# Patient Record
Sex: Female | Born: 1955 | Race: Black or African American | Hispanic: No | Marital: Married | Smoking: Never smoker
Health system: Southern US, Community
[De-identification: ages and names within clinical notes are randomized; demographics above are authoritative.]

## PROBLEM LIST (undated history)

## (undated) DIAGNOSIS — I639 Cerebral infarction, unspecified: Secondary | ICD-10-CM

## (undated) DIAGNOSIS — I10 Essential (primary) hypertension: Secondary | ICD-10-CM

## (undated) DIAGNOSIS — J45909 Unspecified asthma, uncomplicated: Secondary | ICD-10-CM

## (undated) DIAGNOSIS — E785 Hyperlipidemia, unspecified: Secondary | ICD-10-CM

## (undated) HISTORY — DX: Cerebral infarction, unspecified: I63.9

## (undated) HISTORY — PX: COLONOSCOPY: SHX174

## (undated) HISTORY — DX: Unspecified asthma, uncomplicated: J45.909

## (undated) HISTORY — DX: Hyperlipidemia, unspecified: E78.5

## (undated) HISTORY — DX: Essential (primary) hypertension: I10

---

## 2001-05-09 ENCOUNTER — Other Ambulatory Visit: Admission: RE | Admit: 2001-05-09 | Discharge: 2001-05-09 | Payer: Self-pay | Admitting: Obstetrics and Gynecology

## 2003-05-16 ENCOUNTER — Encounter: Payer: Self-pay | Admitting: Family Medicine

## 2003-05-16 ENCOUNTER — Ambulatory Visit (HOSPITAL_COMMUNITY): Admission: RE | Admit: 2003-05-16 | Discharge: 2003-05-16 | Payer: Self-pay | Admitting: Family Medicine

## 2005-05-20 ENCOUNTER — Ambulatory Visit (HOSPITAL_COMMUNITY): Admission: RE | Admit: 2005-05-20 | Discharge: 2005-05-20 | Payer: Self-pay | Admitting: Family Medicine

## 2006-08-17 ENCOUNTER — Ambulatory Visit (HOSPITAL_COMMUNITY): Admission: RE | Admit: 2006-08-17 | Discharge: 2006-08-17 | Payer: Self-pay | Admitting: Family Medicine

## 2006-09-29 ENCOUNTER — Emergency Department (HOSPITAL_COMMUNITY): Admission: EM | Admit: 2006-09-29 | Discharge: 2006-09-29 | Payer: Self-pay | Admitting: Emergency Medicine

## 2007-08-21 ENCOUNTER — Ambulatory Visit (HOSPITAL_COMMUNITY): Admission: RE | Admit: 2007-08-21 | Discharge: 2007-08-21 | Payer: Self-pay | Admitting: Family Medicine

## 2007-10-17 ENCOUNTER — Ambulatory Visit (HOSPITAL_COMMUNITY): Admission: RE | Admit: 2007-10-17 | Discharge: 2007-10-17 | Payer: Self-pay | Admitting: Gastroenterology

## 2007-10-17 ENCOUNTER — Ambulatory Visit: Payer: Self-pay | Admitting: Gastroenterology

## 2008-08-26 ENCOUNTER — Ambulatory Visit (HOSPITAL_COMMUNITY): Admission: RE | Admit: 2008-08-26 | Discharge: 2008-08-26 | Payer: Self-pay | Admitting: Family Medicine

## 2008-10-12 ENCOUNTER — Emergency Department (HOSPITAL_COMMUNITY): Admission: EM | Admit: 2008-10-12 | Discharge: 2008-10-12 | Payer: Self-pay | Admitting: Emergency Medicine

## 2008-11-01 ENCOUNTER — Emergency Department (HOSPITAL_COMMUNITY): Admission: EM | Admit: 2008-11-01 | Discharge: 2008-11-01 | Payer: Self-pay | Admitting: Emergency Medicine

## 2008-12-31 ENCOUNTER — Ambulatory Visit (HOSPITAL_COMMUNITY): Admission: RE | Admit: 2008-12-31 | Discharge: 2008-12-31 | Payer: Self-pay | Admitting: Family Medicine

## 2009-02-05 ENCOUNTER — Ambulatory Visit (HOSPITAL_COMMUNITY): Admission: RE | Admit: 2009-02-05 | Discharge: 2009-02-05 | Payer: Self-pay | Admitting: Neurology

## 2009-03-24 ENCOUNTER — Ambulatory Visit (HOSPITAL_COMMUNITY): Admission: RE | Admit: 2009-03-24 | Discharge: 2009-03-24 | Payer: Self-pay | Admitting: Neurology

## 2009-03-24 ENCOUNTER — Ambulatory Visit: Payer: Self-pay | Admitting: Cardiology

## 2009-03-24 ENCOUNTER — Encounter: Payer: Self-pay | Admitting: Neurology

## 2009-09-03 ENCOUNTER — Ambulatory Visit (HOSPITAL_COMMUNITY): Admission: RE | Admit: 2009-09-03 | Discharge: 2009-09-03 | Payer: Self-pay | Admitting: Family Medicine

## 2010-09-07 ENCOUNTER — Ambulatory Visit (HOSPITAL_COMMUNITY): Admission: RE | Admit: 2010-09-07 | Discharge: 2010-09-07 | Payer: Self-pay | Admitting: Family Medicine

## 2011-04-13 NOTE — Op Note (Signed)
NAME:  Erin Foster, Erin Foster              ACCOUNT NO.:  [id number]   MEDICAL RECORD NO.:  [id number]          PATIENT TYPE:  AMB   LOCATION:  DAY                           FACILITY:  [facility]   PHYSICIAN:  Kassie Mends, M.D.      DATE OF BIRTH:  March 08, 1956   DATE OF PROCEDURE:  10/17/2007  DATE OF DISCHARGE:                               OPERATIVE REPORT   REFERRING PHYSICIAN:  Donna Bernard, M.D.   INDICATION FOR EXAM:  Ms. Erin Foster is a 55 year old female who presents  for average risk colon cancer screening.   FINDINGS:  1. Normal colon without evidence of polyps, masses, inflammatory      changes, diverticula or AVMs.  2. Normal retroflexed view of the rectum.   RECOMMENDATIONS:  1. She should follow a high fiber diet.  She is given a handout on      high-fiber diet.  2. Screening colonoscopy in 10 years.   MEDICATIONS:  1. Demerol 50 mg IV.  2. Versed 4 mg IV.   PROCEDURE TECHNIQUE:  Physical exam was performed.  Informed consent was  obtained from the patient after explaining the benefits, risks and  alternatives to the procedure. The patient was connected to the monitor  and placed in the left lateral position.  Continuous oxygen was provided  by nasal cannula, IV medicine administered through an indwelling  cannula.  After administration of sedation and rectal exam, the  patient's rectum was intubated and the scope was advanced under direct  visualization to the cecum.  The scope was removed slowly by carefully  examining the color, texture, anatomy and integrity of the mucosa on the  way out.  The patient was recovered in endoscopy and discharged home in  satisfactory condition.      Kassie Mends, M.D.  Electronically Signed     SM/MEDQ  D:  10/17/2007  T:  10/17/2007  Job:  [id number]   cc:   Donna Bernard, M.D.  Fax: [phone]

## 2011-08-31 LAB — DIFFERENTIAL
Eosinophils Absolute: 0.1
Eosinophils Relative: 1
Lymphs Abs: 1.3
Monocytes Absolute: 0.4
Monocytes Relative: 6

## 2011-08-31 LAB — BASIC METABOLIC PANEL
CO2: 28
Chloride: 101
GFR calc Af Amer: >60
Potassium: 3.3 — ABNORMAL LOW

## 2011-08-31 LAB — URINALYSIS, ROUTINE W REFLEX MICROSCOPIC
Glucose, UA: NEGATIVE
Hgb urine dipstick: NEGATIVE
pH: 6

## 2011-08-31 LAB — LIPASE, BLOOD: Lipase: 12

## 2011-08-31 LAB — HEPATIC FUNCTION PANEL
Albumin: 3.9
Alkaline Phosphatase: 50
Bilirubin, Direct: 0.1
Indirect Bilirubin: 1.1 — ABNORMAL HIGH
Total Bilirubin: 1.2

## 2011-08-31 LAB — CBC
HCT: 46.1 — ABNORMAL HIGH
Hemoglobin: 15.6 — ABNORMAL HIGH
MCV: 83
RBC: 5.56 — ABNORMAL HIGH
WBC: 6.5

## 2011-09-01 ENCOUNTER — Other Ambulatory Visit: Payer: Self-pay | Admitting: Family Medicine

## 2011-09-01 DIAGNOSIS — Z139 Encounter for screening, unspecified: Secondary | ICD-10-CM

## 2011-09-03 LAB — POCT I-STAT, CHEM 8
BUN: 12 mg/dL (ref 6–23)
Calcium, Ion: 1.08 mmol/L — ABNORMAL LOW (ref 1.12–1.32)
TCO2: 29 mmol/L (ref 0–100)

## 2011-09-10 ENCOUNTER — Ambulatory Visit (HOSPITAL_COMMUNITY)
Admission: RE | Admit: 2011-09-10 | Discharge: 2011-09-10 | Disposition: A | Discharge status: Home / Self Care | Payer: BC Managed Care – PPO | Source: Ambulatory Visit | Attending: Family Medicine | Admitting: Family Medicine

## 2011-09-10 DIAGNOSIS — Z139 Encounter for screening, unspecified: Secondary | ICD-10-CM

## 2011-09-10 DIAGNOSIS — Z1231 Encounter for screening mammogram for malignant neoplasm of breast: Secondary | ICD-10-CM | POA: Insufficient documentation

## 2012-09-07 ENCOUNTER — Other Ambulatory Visit: Payer: Self-pay | Admitting: Family Medicine

## 2012-09-07 DIAGNOSIS — Z139 Encounter for screening, unspecified: Secondary | ICD-10-CM

## 2012-09-12 ENCOUNTER — Ambulatory Visit (HOSPITAL_COMMUNITY)
Admission: RE | Admit: 2012-09-12 | Discharge: 2012-09-12 | Disposition: A | Discharge status: Home / Self Care | Payer: BC Managed Care – PPO | Source: Ambulatory Visit | Attending: Family Medicine | Admitting: Family Medicine

## 2012-09-12 DIAGNOSIS — Z1231 Encounter for screening mammogram for malignant neoplasm of breast: Secondary | ICD-10-CM | POA: Insufficient documentation

## 2012-09-12 DIAGNOSIS — Z139 Encounter for screening, unspecified: Secondary | ICD-10-CM

## 2013-02-28 ENCOUNTER — Other Ambulatory Visit (HOSPITAL_COMMUNITY): Payer: Self-pay | Admitting: Family Medicine

## 2013-02-28 ENCOUNTER — Other Ambulatory Visit: Payer: Self-pay | Admitting: *Deleted

## 2013-03-02 ENCOUNTER — Other Ambulatory Visit: Payer: Self-pay | Admitting: *Deleted

## 2013-03-02 ENCOUNTER — Other Ambulatory Visit (HOSPITAL_COMMUNITY): Payer: Self-pay | Admitting: Family Medicine

## 2013-03-02 MED ORDER — TRIAMTERENE-HCTZ 37.5-25 MG PO CAPS: 1.0000 | ORAL_CAPSULE | ORAL | Status: DC

## 2013-04-12 ENCOUNTER — Other Ambulatory Visit (HOSPITAL_COMMUNITY): Payer: Self-pay | Admitting: Family Medicine

## 2013-04-16 ENCOUNTER — Other Ambulatory Visit (HOSPITAL_COMMUNITY): Payer: Self-pay | Admitting: Family Medicine

## 2013-06-03 ENCOUNTER — Other Ambulatory Visit (HOSPITAL_COMMUNITY): Payer: Self-pay | Admitting: Family Medicine

## 2013-06-05 ENCOUNTER — Encounter: Payer: Self-pay | Admitting: *Deleted

## 2013-07-24 ENCOUNTER — Encounter: Payer: Self-pay | Admitting: Family Medicine

## 2013-07-24 ENCOUNTER — Ambulatory Visit (INDEPENDENT_AMBULATORY_CARE_PROVIDER_SITE_OTHER): Payer: BC Managed Care – PPO | Admitting: Family Medicine

## 2013-07-24 VITALS — BP 132/84 | Ht 61.0 in | Wt 154.0 lb

## 2013-07-24 DIAGNOSIS — E782 Mixed hyperlipidemia: Secondary | ICD-10-CM

## 2013-07-24 DIAGNOSIS — Z8673 Personal history of transient ischemic attack (TIA), and cerebral infarction without residual deficits: Secondary | ICD-10-CM

## 2013-07-24 DIAGNOSIS — E785 Hyperlipidemia, unspecified: Secondary | ICD-10-CM

## 2013-07-24 DIAGNOSIS — Z79899 Other long term (current) drug therapy: Secondary | ICD-10-CM

## 2013-07-24 DIAGNOSIS — I10 Essential (primary) hypertension: Secondary | ICD-10-CM

## 2013-07-24 MED ORDER — TRIAMTERENE-HCTZ 37.5-25 MG PO CAPS: 1.0000 | ORAL_CAPSULE | ORAL | Status: DC

## 2013-07-24 MED ORDER — LISINOPRIL 5 MG PO TABS: ORAL_TABLET | ORAL | Status: DC

## 2013-07-24 MED ORDER — AMLODIPINE BESYLATE 10 MG PO TABS: ORAL_TABLET | ORAL | Status: DC

## 2013-07-24 MED ORDER — SIMVASTATIN 20 MG PO TABS: 20.0000 mg | ORAL_TABLET | Freq: Every evening | ORAL | Status: DC

## 2013-07-24 MED ORDER — DICLOFENAC SODIUM 75 MG PO TBEC: 75.0000 mg | DELAYED_RELEASE_TABLET | Freq: Two times a day (BID) | ORAL | Status: DC

## 2013-07-24 NOTE — Patient Instructions (Signed)
Exercise shoulder as recommended

## 2013-07-24 NOTE — Progress Notes (Signed)
  Subjective:    Patient ID: Erin Foster, female    DOB: [birth year], 57 y.o.   MRN: [id number]  Hypertension This is a chronic problem. The current episode started more than 1 year ago. The problem is unchanged. The problem is controlled. Pertinent negatives include no anxiety, blurred vision or chest pain. Risk factors for coronary artery disease include dyslipidemia. Past treatments include ACE inhibitors and calcium channel blockers. The current treatment provides moderate improvement. Compliance problems: so so on diet anf exercise.  Hypertensive end-organ damage includes CVA.  Hyperlipidemia This is a chronic problem. The problem is controlled. Recent lipid tests were reviewed and are variable. She has no history of hypothyroidism or liver disease. Factors aggravating her hyperlipidemia include fatty foods. Pertinent negatives include no chest pain. Current antihyperlipidemic treatment includes statins and exercise. The current treatment provides moderate improvement of lipids. There are no compliance problems.     Patient arrives for a follow up on her blood pressure.    Patient also having problems with right shoulder pain.right shoulder, grinding sens intermittently, no injury, right handed, No meds, hurts any time  Review of Systems  Eyes: Negative for blurred vision.  Cardiovascular: Negative for chest pain.       Objective:   Physical Exam  Alert no acute distress. Positive impingement sign in the right shoulder. Lungs clear. Heart regular in rhythm. HEENT normal. Ankles without edema. No focal neurological deficits. Significant pain with rotation of shoulder.      Asses sment & Plan:  Impression #1 shoulder strain. #2 hypertension good control. #3 hyperlipidemia status uncertain discuss. Plan Codman's exercises recommended. Voltaren twice a day with food when necessary for pain. Refill medications. Appropriate blood work. Diet exercise discussed. WSL

## 2013-07-28 LAB — HEPATIC FUNCTION PANEL
ALT: 19 U/L (ref 0–35)
Bilirubin, Direct: 0.1 mg/dL (ref 0.0–0.3)

## 2013-07-28 LAB — LIPID PANEL
Cholesterol: 205 mg/dL — ABNORMAL HIGH (ref 0–200)
Total CHOL/HDL Ratio: 2.1 Ratio
VLDL: 8 mg/dL (ref 0–40)

## 2013-08-01 ENCOUNTER — Encounter: Payer: Self-pay | Admitting: Family Medicine

## 2013-08-15 ENCOUNTER — Other Ambulatory Visit: Payer: Self-pay | Admitting: Family Medicine

## 2013-09-17 ENCOUNTER — Other Ambulatory Visit: Payer: Self-pay | Admitting: Family Medicine

## 2013-09-17 DIAGNOSIS — Z139 Encounter for screening, unspecified: Secondary | ICD-10-CM

## 2013-09-18 ENCOUNTER — Ambulatory Visit (HOSPITAL_COMMUNITY)
Admission: RE | Admit: 2013-09-18 | Discharge: 2013-09-18 | Disposition: A | Discharge status: Home / Self Care | Payer: BC Managed Care – PPO | Source: Ambulatory Visit | Attending: Family Medicine | Admitting: Family Medicine

## 2013-09-18 DIAGNOSIS — Z1231 Encounter for screening mammogram for malignant neoplasm of breast: Secondary | ICD-10-CM | POA: Insufficient documentation

## 2013-09-18 DIAGNOSIS — Z139 Encounter for screening, unspecified: Secondary | ICD-10-CM

## 2013-12-25 ENCOUNTER — Ambulatory Visit (INDEPENDENT_AMBULATORY_CARE_PROVIDER_SITE_OTHER): Payer: BC Managed Care – PPO | Admitting: Nurse Practitioner

## 2013-12-25 ENCOUNTER — Encounter: Payer: Self-pay | Admitting: Nurse Practitioner

## 2013-12-25 VITALS — BP 110/80 | Ht 60.5 in | Wt 156.1 lb

## 2013-12-25 DIAGNOSIS — N390 Urinary tract infection, site not specified: Secondary | ICD-10-CM

## 2013-12-25 DIAGNOSIS — N950 Postmenopausal bleeding: Secondary | ICD-10-CM

## 2013-12-25 DIAGNOSIS — Z01419 Encounter for gynecological examination (general) (routine) without abnormal findings: Secondary | ICD-10-CM

## 2013-12-25 DIAGNOSIS — E785 Hyperlipidemia, unspecified: Secondary | ICD-10-CM

## 2013-12-25 DIAGNOSIS — Z0000 Encounter for general adult medical examination without abnormal findings: Secondary | ICD-10-CM

## 2013-12-25 DIAGNOSIS — E559 Vitamin D deficiency, unspecified: Secondary | ICD-10-CM

## 2013-12-25 DIAGNOSIS — I10 Essential (primary) hypertension: Secondary | ICD-10-CM

## 2013-12-25 DIAGNOSIS — Z23 Encounter for immunization: Secondary | ICD-10-CM

## 2013-12-25 LAB — POCT URINALYSIS DIPSTICK
PH UA: 6
RBC UA: 250
SPEC GRAV UA: 1.01

## 2013-12-25 LAB — POCT UA - MICROSCOPIC ONLY: BACTERIA, U MICROSCOPIC: POSITIVE

## 2013-12-25 MED ORDER — CIPROFLOXACIN HCL 250 MG PO TABS: 250.0000 mg | ORAL_TABLET | Freq: Two times a day (BID) | ORAL | Status: DC

## 2013-12-28 ENCOUNTER — Ambulatory Visit (HOSPITAL_COMMUNITY): Admission: RE | Admit: 2013-12-28 | Payer: BC Managed Care – PPO | Source: Ambulatory Visit

## 2013-12-28 ENCOUNTER — Ambulatory Visit (HOSPITAL_COMMUNITY)
Admission: RE | Admit: 2013-12-28 | Discharge: 2013-12-28 | Disposition: A | Discharge status: Home / Self Care | Payer: BC Managed Care – PPO | Source: Ambulatory Visit | Attending: Nurse Practitioner | Admitting: Nurse Practitioner

## 2013-12-28 DIAGNOSIS — N950 Postmenopausal bleeding: Secondary | ICD-10-CM | POA: Insufficient documentation

## 2013-12-30 DIAGNOSIS — N950 Postmenopausal bleeding: Secondary | ICD-10-CM | POA: Insufficient documentation

## 2013-12-30 DIAGNOSIS — E559 Vitamin D deficiency, unspecified: Secondary | ICD-10-CM | POA: Insufficient documentation

## 2013-12-30 NOTE — Assessment & Plan Note (Signed)
Stable, labs pending.

## 2013-12-30 NOTE — Assessment & Plan Note (Signed)
Labs pending.  

## 2013-12-30 NOTE — Progress Notes (Signed)
Subjective:    Patient ID: Erin Foster, female    DOB: [birth year], 57 y.o.   MRN: [id number]  HPI presents for wellness exam. Did not have a menstrual cycle for over a year. Had some spotting in August, September, October and again this month. Very light spotting, had to wear a light pad for one day. Same sexual partner. No pelvic pain. No vaginal discharge. Regular vision and eye exams. Yesterday began having urinary urgency and frequency. Some dysuria. No fever. No back or flank pain. Mild mid pelvic area pressure. No history of recent UTI.    Review of Systems  Constitutional: Negative for activity change, appetite change and fatigue.  HENT: Negative for dental problem, ear pain, sinus pressure and sore throat.   Respiratory: Negative for cough, chest tightness, shortness of breath and wheezing.   Cardiovascular: Negative for chest pain and leg swelling.  Gastrointestinal: Negative for nausea, vomiting, abdominal pain, diarrhea, constipation and blood in stool.  Genitourinary: Positive for dysuria, urgency, frequency, vaginal bleeding, difficulty urinating and menstrual problem. Negative for flank pain, vaginal discharge and pelvic pain.       Objective:   Physical Exam  Vitals reviewed. Constitutional: She is oriented to person, place, and time. She appears well-developed. No distress.  HENT:  Right Ear: External ear normal.  Left Ear: External ear normal.  Mouth/Throat: Oropharynx is clear and moist.  Neck: Normal range of motion. Neck supple. No tracheal deviation present. No thyromegaly present.  Cardiovascular: Normal rate, regular rhythm and normal heart sounds.  Exam reveals no gallop.   No murmur heard. Pulmonary/Chest: Effort normal and breath sounds normal.  Abdominal: Soft. She exhibits no distension. There is no tenderness.  Genitourinary: Vagina normal and uterus normal. No vaginal discharge found.  Musculoskeletal: She exhibits no edema.  Lymphadenopathy:   She has no cervical adenopathy.  Neurological: She is alert and oriented to person, place, and time.  Skin: Skin is warm and dry. No rash noted.  Psychiatric: She has a normal mood and affect. Her behavior is normal.   Breast exam: No masses noted, axilla no adenopathy. External GU normal. Vagina slightly pink with clear discharge. Cervix normal limit in appearance, no CMT. Bimanual exam uterus and adnexa normal and nontender. Mild suprapubic area tenderness on exam. No CVA or flank tenderness on exam. Urine microscopic WBC TNTC, RBCs TNTC and bacteria noted. Rectal exam normal, no stool for Hemoccult.       Assessment & Plan:  Well woman exam  UTI (urinary tract infection) - Plan: POCT urinalysis dipstick, POCT UA - Microscopic Only  Postmenopausal bleeding - Plan: Estradiol, FSH, LH, Progesterone, US Pelvis Complete, US Transvaginal Non-OB  Essential hypertension, benign - Plan: Basic metabolic panel  Other and unspecified hyperlipidemia - Plan: Lipid panel  Routine general medical examination at a health care facility - Plan: Basic metabolic panel, Hepatic function panel, TSH, POC Hemoccult Bld/Stl (3-Cd Home Screen)  Vitamin D insufficiency - Plan: Vit D  25 hydroxy (rtn osteoporosis monitoring)  Need for prophylactic vaccination with combined diphtheria-tetanus-pertussis (DTP) vaccine - Plan: Tdap vaccine greater than or equal to 7yo IM  Meds ordered this encounter  Medications  . ciprofloxacin (CIPRO) 250 MG tablet    Sig: Take 1 tablet (250 mg total) by mouth 2 (two) times daily.    Dispense:  10 tablet    Refill:  0    Order Specific Question:  Supervising Provider    Answer:  Merlyn Albert [[id number]]   Increase  clear fluid intake. Reviewed warning signs. Call back in 48-72 hours if no improvement, sooner if worse. Recommend healthy diet regular activity and vitamin D. and calcium supplementation. Schedule pelvic ultrasound to assess endometrial lining. Routine  followup in 6 months, Next physical in one year.

## 2013-12-30 NOTE — Assessment & Plan Note (Signed)
Pelvic ultrasound and labs pending.

## 2014-01-01 ENCOUNTER — Other Ambulatory Visit: Payer: Self-pay | Admitting: Family Medicine

## 2014-02-06 ENCOUNTER — Other Ambulatory Visit: Payer: Self-pay | Admitting: Family Medicine

## 2014-03-08 ENCOUNTER — Other Ambulatory Visit: Payer: Self-pay | Admitting: Family Medicine

## 2014-04-16 ENCOUNTER — Ambulatory Visit (INDEPENDENT_AMBULATORY_CARE_PROVIDER_SITE_OTHER): Payer: BC Managed Care – PPO | Admitting: Family Medicine

## 2014-04-16 ENCOUNTER — Encounter: Payer: Self-pay | Admitting: Family Medicine

## 2014-04-16 VITALS — BP 140/90 | Ht 60.5 in | Wt 155.0 lb

## 2014-04-16 DIAGNOSIS — Z8673 Personal history of transient ischemic attack (TIA), and cerebral infarction without residual deficits: Secondary | ICD-10-CM

## 2014-04-16 DIAGNOSIS — E559 Vitamin D deficiency, unspecified: Secondary | ICD-10-CM

## 2014-04-16 DIAGNOSIS — I10 Essential (primary) hypertension: Secondary | ICD-10-CM

## 2014-04-16 DIAGNOSIS — Z79899 Other long term (current) drug therapy: Secondary | ICD-10-CM

## 2014-04-16 DIAGNOSIS — E785 Hyperlipidemia, unspecified: Secondary | ICD-10-CM

## 2014-04-16 LAB — HEPATIC FUNCTION PANEL
ALK PHOS: 57 U/L (ref 39–117)
ALT: 16 U/L (ref 0–35)
AST: 21 U/L (ref 0–37)
Albumin: 3.8 g/dL (ref 3.5–5.2)
BILIRUBIN DIRECT: 0.1 mg/dL (ref 0.0–0.3)
BILIRUBIN INDIRECT: 0.5 mg/dL (ref 0.2–1.2)
BILIRUBIN TOTAL: 0.6 mg/dL (ref 0.2–1.2)
Total Protein: 7.1 g/dL (ref 6.0–8.3)

## 2014-04-16 LAB — BASIC METABOLIC PANEL
BUN: 14 mg/dL (ref 6–23)
CHLORIDE: 103 meq/L (ref 96–112)
CO2: 28 meq/L (ref 19–32)
CREATININE: 0.84 mg/dL (ref 0.50–1.10)
Calcium: 9.6 mg/dL (ref 8.4–10.5)
GLUCOSE: 84 mg/dL (ref 70–99)
POTASSIUM: 4.7 meq/L (ref 3.5–5.3)
Sodium: 139 mEq/L (ref 135–145)

## 2014-04-16 LAB — LIPID PANEL
Cholesterol: 187 mg/dL (ref 0–200)
HDL: 84 mg/dL (ref >39)
LDL CALC: 94 mg/dL (ref 0–99)
Total CHOL/HDL Ratio: 2.2 Ratio
Triglycerides: 44 mg/dL (ref <150)
VLDL: 9 mg/dL (ref 0–40)

## 2014-04-16 MED ORDER — SIMVASTATIN 20 MG PO TABS: ORAL_TABLET | ORAL | Status: DC

## 2014-04-16 MED ORDER — AMLODIPINE BESYLATE 10 MG PO TABS: ORAL_TABLET | ORAL | Status: DC

## 2014-04-16 MED ORDER — TRIAMTERENE-HCTZ 37.5-25 MG PO CAPS: ORAL_CAPSULE | ORAL | Status: DC

## 2014-04-16 MED ORDER — LISINOPRIL 5 MG PO TABS: ORAL_TABLET | ORAL | Status: DC

## 2014-04-16 NOTE — Progress Notes (Signed)
   Subjective:    Patient ID: Erin Foster, female    DOB: September 19, 1956, 58 y.o.   MRN: [id number]  HPI Patient is here today for a check up.  BW orders have been in since 12/25/13. She said she was unaware and that she will get her blood drawn today.  Pt needs a refill on her meds.   Pt would like for you to check her ears for fluid d/t sinuses.   ssalt intake too much per pt  Walking three d per wk  Chol diet watching closely, taking meds faithfuly  No tias no stoke sympotms  PA at work gave her cough med and abx, pt didn't take  Left side of nose cong and sore, no major hx of allergies  Review of Systems No chest pain no back pain no weight loss no weight gain ROS otherwise negative    Objective:   Physical Exam Alert no apparent distress HET moderate his congestion vital signs stable neck supple. Lungs clear. Heart regular in rhythm. Ankles edema.       Assessment & Plan:  Impression 1 status post stroke discussed #2 hypertension good control. #3 hyperlipidemia status uncertain. #4 allergic rhinitis discussed plan diet exercise discussed appropriate blood work. Refill all medications. WSL

## 2014-04-24 ENCOUNTER — Encounter: Payer: Self-pay | Admitting: Family Medicine

## 2014-08-21 ENCOUNTER — Encounter: Payer: Self-pay | Admitting: Family Medicine

## 2014-08-21 ENCOUNTER — Ambulatory Visit (INDEPENDENT_AMBULATORY_CARE_PROVIDER_SITE_OTHER): Payer: BC Managed Care – PPO | Admitting: Family Medicine

## 2014-08-21 VITALS — BP 186/106 | Ht 60.5 in | Wt 155.8 lb

## 2014-08-21 DIAGNOSIS — B349 Viral infection, unspecified: Secondary | ICD-10-CM

## 2014-08-21 DIAGNOSIS — Z8673 Personal history of transient ischemic attack (TIA), and cerebral infarction without residual deficits: Secondary | ICD-10-CM

## 2014-08-21 DIAGNOSIS — B9789 Other viral agents as the cause of diseases classified elsewhere: Secondary | ICD-10-CM

## 2014-08-21 DIAGNOSIS — I10 Essential (primary) hypertension: Secondary | ICD-10-CM

## 2014-08-21 MED ORDER — LISINOPRIL 10 MG PO TABS: 10.0000 mg | ORAL_TABLET | Freq: Every day | ORAL | Status: DC

## 2014-08-21 MED ORDER — GENTAMICIN SULFATE 0.3 % OP SOLN: 2.0000 [drp] | Freq: Three times a day (TID) | OPHTHALMIC | Status: DC

## 2014-08-21 NOTE — Progress Notes (Signed)
   Subjective:    Patient ID: Erin Foster, female    DOB: 04/09/1956, 58 y.o.   MRN: [id number]  HPI  Patient arrives with complaint of headache, dizziness and elevated blood pressure. Patient's eye is also red and the vision is blurred.  Review of Systems     Objective:   Physical Exam        Assessment & Plan:

## 2014-08-21 NOTE — Progress Notes (Signed)
   Subjective:    Patient ID: Erin Foster, female    DOB: 1956/11/25, 58 y.o.   MRN: [id number]  HPI  BPs cked at work today  176 over 59  Does not miss a dose of meds  Since last thur, felt head discomfort thru right head.  Feeling dizzy at times  Eyes felt a little bluurred  A lot of runny nose, achiness in the joints,  Disc in muscles and joints   Patient has history of stroke so therefore the symptoms are making her very worried. Nonspecific right-sided headache. Sharp pains at times.  Crustiness in the eyes.  Claims complete compliance with medication.  Not exercising as much as she would hope.  No chest pain. No loss of speech no loss of vision no loss of coordination or walking Review of Systems ROS otherwise negative as noted above    Objective:   Physical Exam Alert blood pressure repeat 1 5292 HEENT normal neck supple left thigh slightly crusty right eye minimal lungs clear heart regular in rhythm neuro exam intact cerebellar function normal strength sensation reflexes intact.       Assessment & Plan:  Impression 1 hypertension suboptimal discussed at length. #2 probable viral in syndrome. Discussed #3 blepharitis discussed plan increase lisinopril. Patient does not need a workup for stroke at this time. She is relieved to hear that. Symptomatic care. Garamycin for eyes. 25 minutes spent most in discussion. WSL

## 2014-10-04 ENCOUNTER — Other Ambulatory Visit: Payer: Self-pay | Admitting: Family Medicine

## 2014-10-04 DIAGNOSIS — Z1231 Encounter for screening mammogram for malignant neoplasm of breast: Secondary | ICD-10-CM

## 2014-10-09 ENCOUNTER — Ambulatory Visit (HOSPITAL_COMMUNITY): Payer: BC Managed Care – PPO

## 2014-10-11 ENCOUNTER — Other Ambulatory Visit: Payer: Self-pay | Admitting: Family Medicine

## 2014-11-11 ENCOUNTER — Ambulatory Visit (HOSPITAL_COMMUNITY)
Admission: RE | Admit: 2014-11-11 | Discharge: 2014-11-11 | Disposition: A | Discharge status: Home / Self Care | Payer: BC Managed Care – PPO | Source: Ambulatory Visit | Attending: Family Medicine | Admitting: Family Medicine

## 2014-11-11 DIAGNOSIS — Z1231 Encounter for screening mammogram for malignant neoplasm of breast: Secondary | ICD-10-CM

## 2014-11-29 IMAGING — US US TRANSVAGINAL NON-OB
1 series · 13 of 25 positions shown · non-contrast
Comparison: None

CLINICAL DATA: Postmenopausal bleeding



[Series 1: us transvaginal non-ob · 0.17mm/px · 13 of 64 slices shown]
[im 1/64]
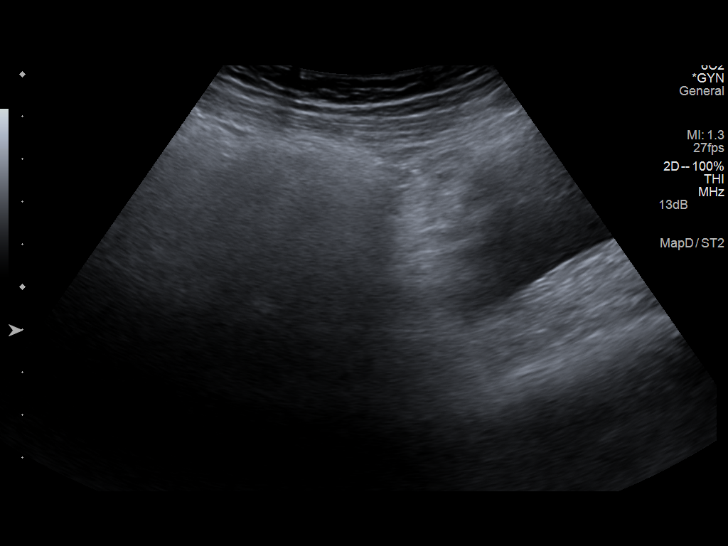
[im 6/64]
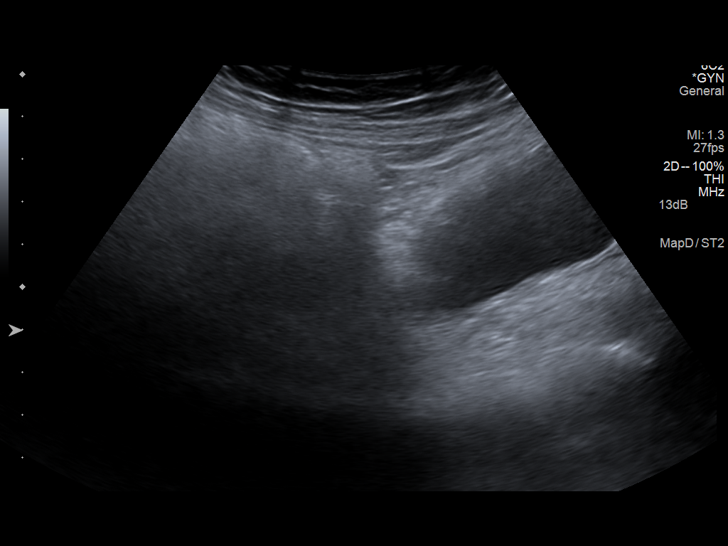
[im 11/64]
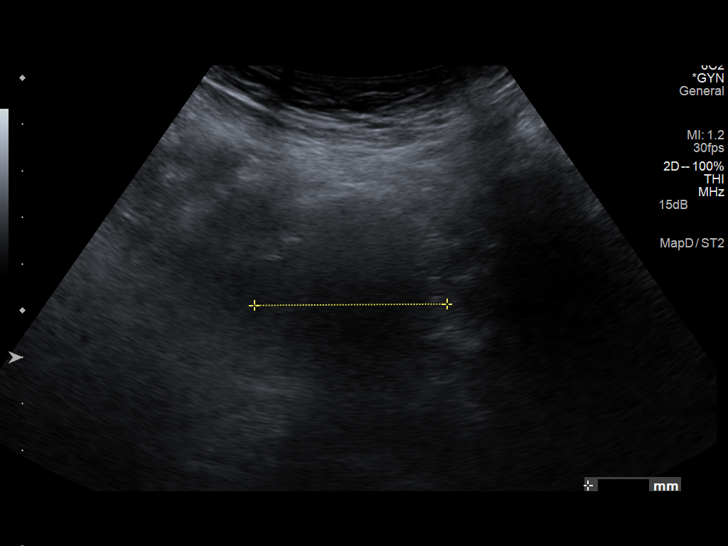
[im 16/64]
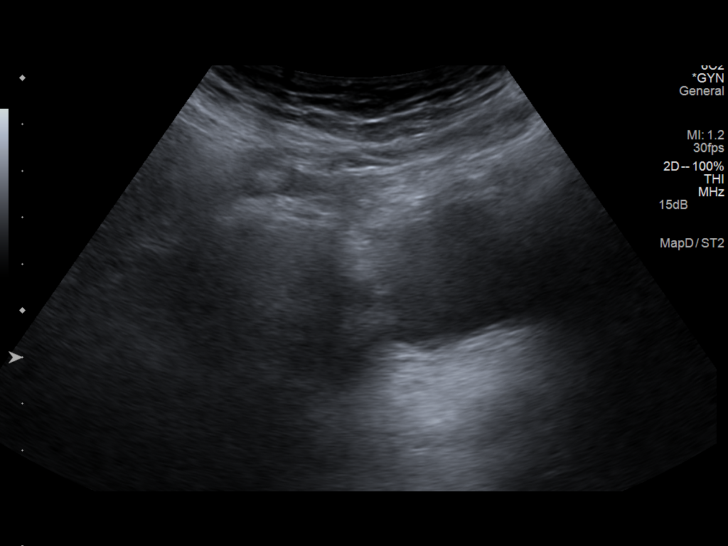
[im 22/64]
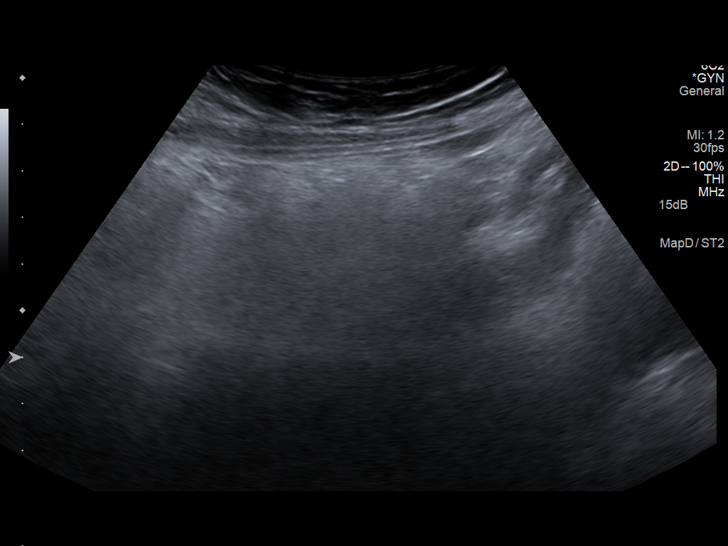
[im 27/64]
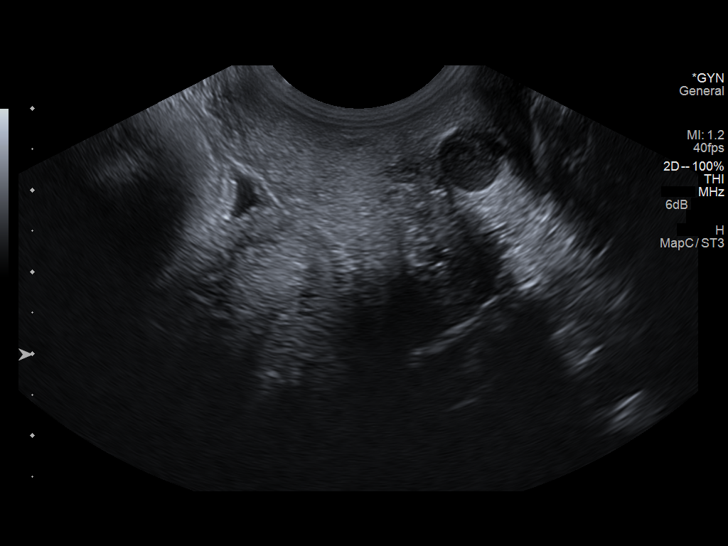
[im 32/64]
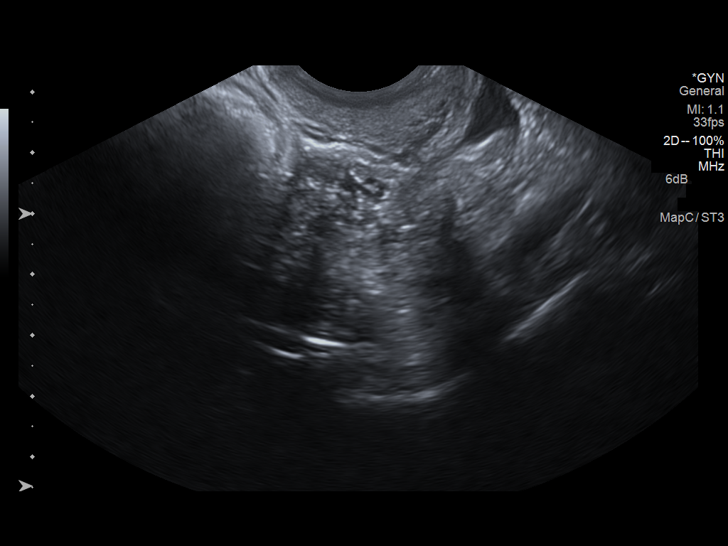
[im 37/64]
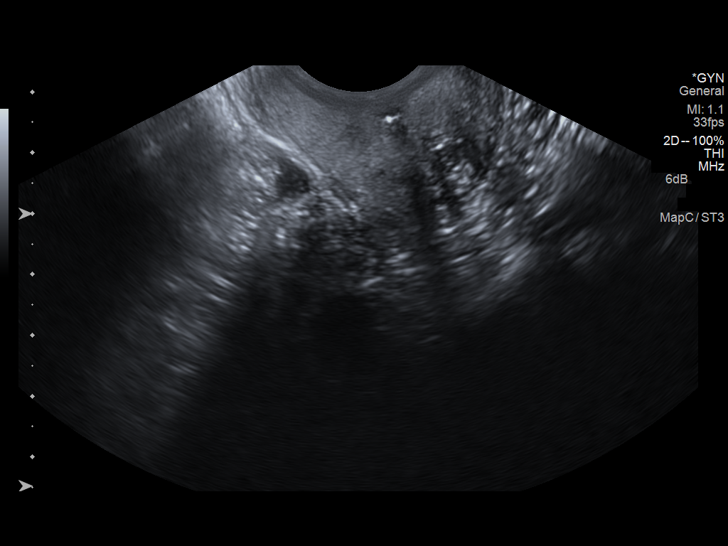
[im 43/64]
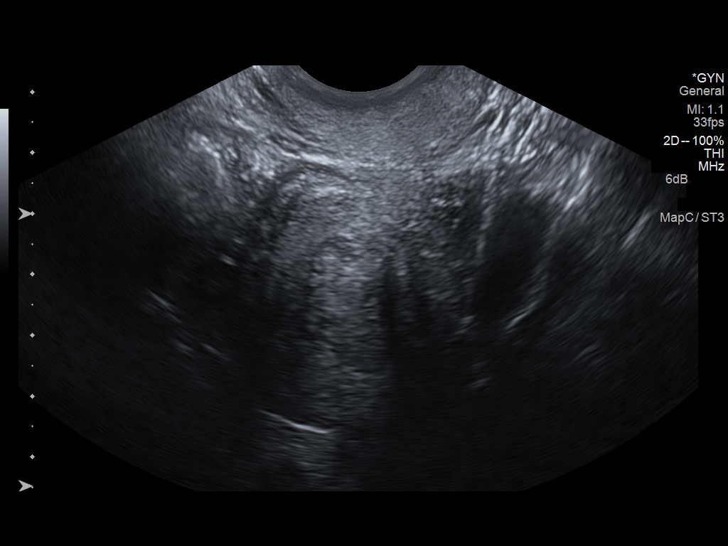
[im 48/64]
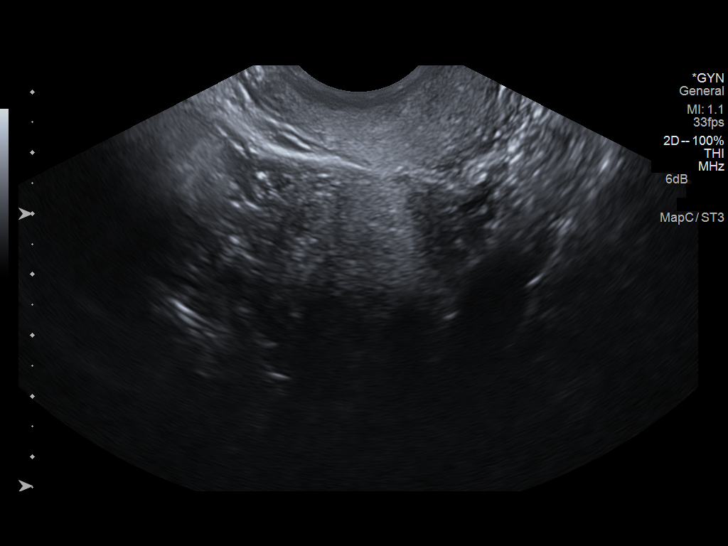
[im 53/64]
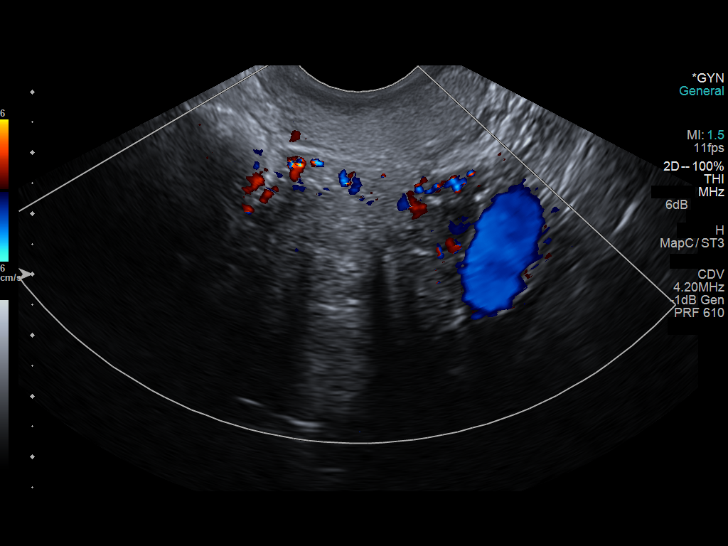
[im 58/64]
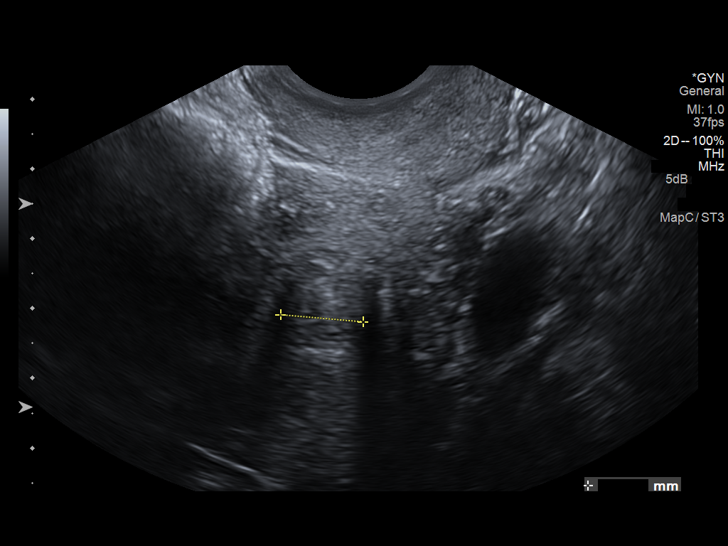
[im 64/64]
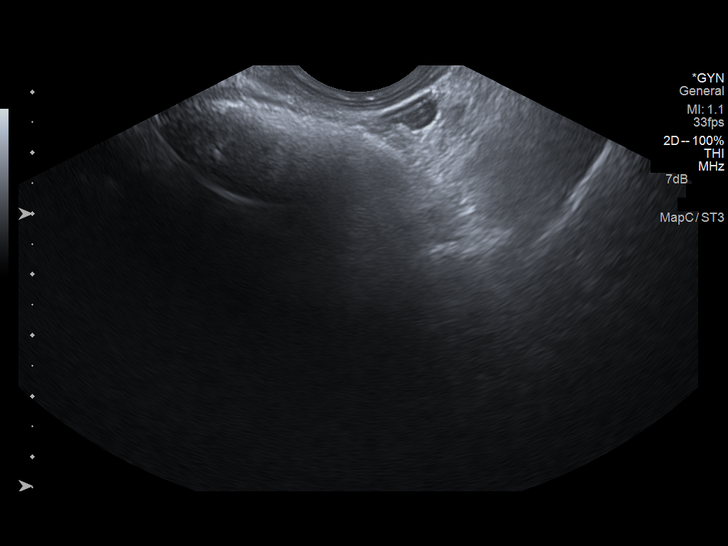

[13 of 25 positions shown; findings below may reference images not displayed]

FINDINGS: Uterus

Measurements: Bowel gas limits evaluation of the uterus. Accurate
measurements are difficult to obtain. The maximal measured
transverse dimension is approximately 4.1 cm. AP and longitudinal
dimensions could not be accurately assessed.

Endometrium

Thickness: 8.1 mm where visualized; a small hypoechoic region
adjacent to the endometrial cavity may be present. It measures 0.9 x
1.3 x 1.2 cm..

Right ovary

Not visualized due to bowel gas.

Left ovary

Not visualized due to bowel gas.

Other findings

The study is limited overall by the large amount of bowel gas
present.
IMPRESSION: This is a technically limited study. As best as can be determined
the endometrial stripe measures 8 mm but a hypoechoic area may be
present within or adjacent to the endometrial canal. The ovaries
could not be demonstrated.

Further evaluation of the pelvic structures with CT scanning or MRI
may be useful.

## 2014-12-04 ENCOUNTER — Other Ambulatory Visit: Payer: Self-pay | Admitting: Family Medicine

## 2014-12-21 ENCOUNTER — Other Ambulatory Visit: Payer: Self-pay | Admitting: Family Medicine

## 2015-02-11 ENCOUNTER — Encounter: Payer: Self-pay | Admitting: Family Medicine

## 2015-02-11 ENCOUNTER — Ambulatory Visit (INDEPENDENT_AMBULATORY_CARE_PROVIDER_SITE_OTHER): Payer: BLUE CROSS/BLUE SHIELD | Admitting: Family Medicine

## 2015-02-11 VITALS — BP 110/74 | Ht 60.5 in | Wt 156.2 lb

## 2015-02-11 DIAGNOSIS — Z79899 Other long term (current) drug therapy: Secondary | ICD-10-CM

## 2015-02-11 DIAGNOSIS — E785 Hyperlipidemia, unspecified: Secondary | ICD-10-CM

## 2015-02-11 MED ORDER — GENTAMICIN SULFATE 0.3 % OP SOLN: 2.0000 [drp] | Freq: Three times a day (TID) | OPHTHALMIC | Status: AC

## 2015-02-11 NOTE — Progress Notes (Signed)
   Subjective:    Patient ID: Erin Foster, female    DOB: May 02, 1956, 59 y.o.   MRN: [id number]  Hypertension This is a chronic problem. The current episode started more than 1 year ago. The problem has been gradually improving since onset. The problem is controlled. There are no associated agents to hypertension. There are no known risk factors for coronary artery disease. Treatments tried: lisinopril, dyazide, amlodipine. The current treatment provides significant improvement. There are no compliance problems.    Patient states that she is having some pain and discharge in her left eye. This has been present off and on for about 9 months now.   Exercise not good, hoping to work on it  Sticking with chol med pretty faithfully. No obvious side effects. Trying to watch fats in diet.  Chronic eye irritation. Left side. Swells up at times. Tender at times. History of needing "cyst" removed from eyelid  History of stroke no recent TIA symptomatology  Review of Systems No headache no chest pain no back pain abdominal pain no change in bowel habits no blood in stool ROS otherwise negative    Objective:   Physical Exam  Alert no acute distress. HEENT left eye small stye noted pharynx normal neck supple. Lungs clear. Heart regular in rhythm. Blood pressure good on repeat.  Left upper eyelid stye    Assessment & Plan:  Impression 1 hypertension good control #2 hyperlipidemia status uncertain #3 left eye stye #4 history stroke clinically stable plan diet exercise discussed. Medications refilled. Appropriate blood work. Check in 6 months. WSL

## 2015-02-12 LAB — BASIC METABOLIC PANEL
BUN / CREAT RATIO: 14 (ref 9–23)
BUN: 14 mg/dL (ref 6–24)
CO2: 24 mmol/L (ref 18–29)
CREATININE: 0.98 mg/dL (ref 0.57–1.00)
Calcium: 9.5 mg/dL (ref 8.7–10.2)
Chloride: 105 mmol/L (ref 97–108)
GFR calc Af Amer: 74 mL/min/{1.73_m2} (ref >59)
GFR calc non Af Amer: 64 mL/min/{1.73_m2} (ref >59)
Glucose: 94 mg/dL (ref 65–99)
Potassium: 4.1 mmol/L (ref 3.5–5.2)
SODIUM: 143 mmol/L (ref 134–144)

## 2015-02-12 LAB — LIPID PANEL
CHOL/HDL RATIO: 1.8 ratio (ref 0.0–4.4)
Cholesterol, Total: 196 mg/dL (ref 100–199)
HDL: 110 mg/dL (ref >39)
LDL CALC: 76 mg/dL (ref 0–99)
TRIGLYCERIDES: 49 mg/dL (ref 0–149)
VLDL CHOLESTEROL CAL: 10 mg/dL (ref 5–40)

## 2015-02-12 LAB — HEPATIC FUNCTION PANEL
ALT: 18 IU/L (ref 0–32)
AST: 19 IU/L (ref 0–40)
Albumin: 3.9 g/dL (ref 3.5–5.5)
Alkaline Phosphatase: 68 IU/L (ref 39–117)
BILIRUBIN TOTAL: 0.5 mg/dL (ref 0.0–1.2)
Bilirubin, Direct: 0.15 mg/dL (ref 0.00–0.40)
Total Protein: 6.5 g/dL (ref 6.0–8.5)

## 2015-02-16 ENCOUNTER — Encounter: Payer: Self-pay | Admitting: Family Medicine

## 2015-03-28 ENCOUNTER — Ambulatory Visit (INDEPENDENT_AMBULATORY_CARE_PROVIDER_SITE_OTHER): Payer: BLUE CROSS/BLUE SHIELD | Admitting: Nurse Practitioner

## 2015-03-28 ENCOUNTER — Encounter: Payer: Self-pay | Admitting: Nurse Practitioner

## 2015-03-28 VITALS — BP 120/78 | Ht 65.0 in | Wt 158.8 lb

## 2015-03-28 DIAGNOSIS — Z124 Encounter for screening for malignant neoplasm of cervix: Secondary | ICD-10-CM

## 2015-03-28 DIAGNOSIS — Z0000 Encounter for general adult medical examination without abnormal findings: Secondary | ICD-10-CM

## 2015-03-28 DIAGNOSIS — Z01419 Encounter for gynecological examination (general) (routine) without abnormal findings: Secondary | ICD-10-CM

## 2015-03-31 ENCOUNTER — Encounter: Payer: Self-pay | Admitting: Nurse Practitioner

## 2015-03-31 NOTE — Progress Notes (Signed)
   Subjective:    Patient ID: Erin Foster, female    DOB: 12/26/1955, 59 y.o.   MRN: [id number]  HPI presents for her wellness exam. Same sexual partner. No vaginal bleeding or pelvic pain. Regular vision and dental exams. Does some exercise. Labs done in March. Vitamin D done at work through [employer].     Review of Systems  Constitutional: Negative for activity change, appetite change and fatigue.  HENT: Negative for dental problem, ear pain, sinus pressure and sore throat.   Respiratory: Negative for cough, chest tightness, shortness of breath and wheezing.   Cardiovascular: Negative for chest pain.  Gastrointestinal: Negative for nausea, vomiting, abdominal pain, diarrhea, constipation, blood in stool and abdominal distention.  Genitourinary: Negative for dysuria, urgency, frequency, vaginal bleeding, vaginal discharge, enuresis, difficulty urinating, genital sores and pelvic pain.       Objective:   Physical Exam  Constitutional: She is oriented to person, place, and time. She appears well-developed. No distress.  HENT:  Right Ear: External ear normal.  Left Ear: External ear normal.  Mouth/Throat: Oropharynx is clear and moist.  Neck: Normal range of motion. Neck supple. No tracheal deviation present. No thyromegaly present.  Cardiovascular: Normal rate, regular rhythm and normal heart sounds.  Exam reveals no gallop.   No murmur heard. Pulmonary/Chest: Effort normal and breath sounds normal.  Abdominal: Soft. She exhibits no distension. There is no tenderness.  Genitourinary: Vagina normal and uterus normal. No vaginal discharge found.  External GU: no rashes or lesions. Vagina: no discharge. Cervix normal in appearance. No CMT. Bimanual exam: no tenderness or masses. Rectal exam: no masses; no stool for hemoccult.   Musculoskeletal: She exhibits no edema.  Lymphadenopathy:    She has no cervical adenopathy.  Neurological: She is alert and oriented to person, place, and time.   Skin: Skin is warm and dry. No rash noted.  Psychiatric: She has a normal mood and affect. Her behavior is normal.  Vitals reviewed. breast exam: no masses; axillae no adenopathy. Reviewed lab results from 02/11/15.       Assessment & Plan:  Well woman exam - Plan: POC Hemoccult Bld/Stl (3-Cd Home Screen)  Screening for cervical cancer - Plan: Pap IG w/ reflex to HPV when ASC-U  Recommend daily vitamin D/calcium, regular exercise and healthy diet.  Return in about 6 months (around 09/27/2015).

## 2015-04-01 LAB — PAP IG W/ RFLX HPV ASCU: PAP Smear Comment: 0

## 2015-04-08 ENCOUNTER — Other Ambulatory Visit: Payer: Self-pay | Admitting: Family Medicine

## 2015-04-14 ENCOUNTER — Other Ambulatory Visit: Payer: Self-pay | Admitting: Family Medicine

## 2015-04-15 ENCOUNTER — Other Ambulatory Visit: Payer: Self-pay | Admitting: Family Medicine

## 2015-06-06 ENCOUNTER — Telehealth: Payer: Self-pay | Admitting: Family Medicine

## 2015-06-06 MED ORDER — TRIAMTERENE-HCTZ 37.5-25 MG PO CAPS: 1.0000 | ORAL_CAPSULE | Freq: Every morning | ORAL | Status: DC

## 2015-06-06 NOTE — Telephone Encounter (Signed)
triamterene-hydrochlorothiazide (DYAZIDE) 37.5-25 MG per capsule  Pt states she has had a OV not sure why she would need one now for this  Refill, [facility]

## 2015-06-06 NOTE — Telephone Encounter (Signed)
Rx sent electronically to pharmacy. Patient notified. 

## 2015-07-07 ENCOUNTER — Other Ambulatory Visit: Payer: Self-pay | Admitting: *Deleted

## 2015-07-07 ENCOUNTER — Telehealth: Payer: Self-pay | Admitting: Family Medicine

## 2015-07-07 MED ORDER — FLUCONAZOLE 150 MG PO TABS: ORAL_TABLET | ORAL | Status: DC

## 2015-07-07 NOTE — Telephone Encounter (Signed)
Pt is requesting diflucan to be called in.    [facility]

## 2015-07-07 NOTE — Telephone Encounter (Signed)
Having white vaginal discharge and itching. Diflucan  one today and one in 3 days. Med sent to pharm. Pt notified.

## 2015-07-07 NOTE — Telephone Encounter (Signed)
[facility] (need more info) 

## 2015-07-21 ENCOUNTER — Encounter: Payer: Self-pay | Admitting: Nurse Practitioner

## 2015-07-21 ENCOUNTER — Ambulatory Visit (INDEPENDENT_AMBULATORY_CARE_PROVIDER_SITE_OTHER): Payer: BLUE CROSS/BLUE SHIELD | Admitting: Nurse Practitioner

## 2015-07-21 VITALS — BP 122/76 | Temp 98.1°F | Ht 60.0 in | Wt 156.0 lb

## 2015-07-21 DIAGNOSIS — B373 Candidiasis of vulva and vagina: Secondary | ICD-10-CM | POA: Diagnosis not present

## 2015-07-21 DIAGNOSIS — N390 Urinary tract infection, site not specified: Secondary | ICD-10-CM | POA: Diagnosis not present

## 2015-07-21 DIAGNOSIS — R82998 Other abnormal findings in urine: Secondary | ICD-10-CM

## 2015-07-21 DIAGNOSIS — B3731 Acute candidiasis of vulva and vagina: Secondary | ICD-10-CM

## 2015-07-21 LAB — POCT UA - MICROSCOPIC ONLY

## 2015-07-21 LAB — POCT WET PREP WITH KOH
Clue Cells Wet Prep HPF POC: NEGATIVE
KOH PREP POC: NEGATIVE
Trichomonas, UA: NEGATIVE
Yeast Wet Prep HPF POC: POSITIVE

## 2015-07-21 LAB — POCT URINALYSIS DIPSTICK
PH UA: 6
Spec Grav, UA: <=1.005

## 2015-07-21 MED ORDER — TERCONAZOLE 0.4 % VA CREA: 1.0000 | TOPICAL_CREAM | Freq: Every day | VAGINAL | Status: DC

## 2015-07-21 NOTE — Progress Notes (Deleted)
   Subjective:    Patient ID: Erin Foster, female    DOB: 25-Apr-1956, 59 y.o.   MRN: [id number]  HPIVaginal itching. Started 2 weeks ago. Took diflucan but not better.     Review of Systems     Objective:   Physical Exam        Assessment & Plan:

## 2015-07-21 NOTE — Patient Instructions (Signed)
Acidophilus supplement   

## 2015-07-23 ENCOUNTER — Encounter: Payer: Self-pay | Admitting: Nurse Practitioner

## 2015-07-23 NOTE — Progress Notes (Signed)
Subjective:  Presents for c/o vaginal itching for the past 2 weeks. Slight vaginal discharge. No odor or color. No fever or pelvic pain. Same partner. Took 2 diflucan, symptoms eased up but came back over the past week. No recent antibiotics. Also requested a urine check. Denies any urinary symptoms.   Objective:   BP 122/76 mmHg  Temp(Src) 98.1 F (36.7 C) (Oral)  Ht 5' (1.524 m)  Wt 156 lb (70.761 kg)  BMI 30.47 kg/m2 NAD. Alert, oriented. Lungs clear. Heart RRR. External GU: no rashes or lesions. Vagina: small amount of white thick discharge. Cervix: normal; no CMT. Bimanual exam: no tenderness.  Results for orders placed or performed in visit on 07/21/15  POCT urinalysis dipstick  Result Value Ref Range   Color, UA     Clarity, UA     Glucose, UA     Bilirubin, UA     Ketones, UA     Spec Grav, UA <=1.005    Blood, UA     pH, UA 6.0    Protein, UA     Urobilinogen, UA     Nitrite, UA     Leukocytes, UA 4+ (A) Negative  POCT UA - Microscopic Only  Result Value Ref Range   WBC, Ur, HPF, POC 0-1    RBC, urine, microscopic 0-1    Bacteria, U Microscopic rare    Mucus, UA     Epithelial cells, urine per micros multiple    Crystals, Ur, HPF, POC     Casts, Ur, LPF, POC     Yeast, UA    POCT Wet Prep with KOH  Result Value Ref Range   Trichomonas, UA Negative    Clue Cells Wet Prep HPF POC neg    Epithelial Wet Prep HPF POC Few Few, Moderate, Many   Yeast Wet Prep HPF POC pos    Bacteria Wet Prep HPF POC None None, Few   RBC Wet Prep HPF POC none    WBC Wet Prep HPF POC none    KOH Prep POC Negative    pH, Wet Prep     All of her recent fasting glucoses have been below 100.  Assessment:  Vaginal candidiasis - Plan: POCT Wet Prep with KOH  Leukocytes in urine - Plan: POCT urinalysis dipstick, POCT UA - Microscopic Only  Plan:  Meds ordered this encounter  Medications  . terconazole (TERAZOL 7) 0.4 % vaginal cream    Sig: Place 1 applicator vaginally at bedtime.  x7d    Dispense:  45 g    Refill:  0    Order Specific Question:  Supervising Provider    Answer:  Merlyn Albert [[id number]]   Call back if persists or recurs. Also recommend Acidophilus as directed for prevention.

## 2015-08-01 ENCOUNTER — Other Ambulatory Visit: Payer: Self-pay | Admitting: *Deleted

## 2015-08-01 MED ORDER — LISINOPRIL 10 MG PO TABS: 10.0000 mg | ORAL_TABLET | Freq: Every day | ORAL | Status: DC

## 2015-08-28 ENCOUNTER — Ambulatory Visit (INDEPENDENT_AMBULATORY_CARE_PROVIDER_SITE_OTHER): Payer: BLUE CROSS/BLUE SHIELD | Admitting: Nurse Practitioner

## 2015-08-28 ENCOUNTER — Encounter: Payer: Self-pay | Admitting: Nurse Practitioner

## 2015-08-28 VITALS — BP 118/80 | Ht 60.0 in | Wt 158.2 lb

## 2015-08-28 DIAGNOSIS — E785 Hyperlipidemia, unspecified: Secondary | ICD-10-CM | POA: Diagnosis not present

## 2015-08-28 DIAGNOSIS — I10 Essential (primary) hypertension: Secondary | ICD-10-CM

## 2015-08-28 DIAGNOSIS — Z79899 Other long term (current) drug therapy: Secondary | ICD-10-CM | POA: Diagnosis not present

## 2015-08-28 DIAGNOSIS — E559 Vitamin D deficiency, unspecified: Secondary | ICD-10-CM | POA: Diagnosis not present

## 2015-08-28 MED ORDER — TRIAMTERENE-HCTZ 37.5-25 MG PO CAPS: 1.0000 | ORAL_CAPSULE | Freq: Every morning | ORAL | Status: DC

## 2015-08-28 MED ORDER — SIMVASTATIN 20 MG PO TABS: 20.0000 mg | ORAL_TABLET | Freq: Every evening | ORAL | Status: DC

## 2015-08-28 MED ORDER — AMLODIPINE BESYLATE 10 MG PO TABS: 10.0000 mg | ORAL_TABLET | Freq: Every day | ORAL | Status: DC

## 2015-08-28 MED ORDER — LISINOPRIL 10 MG PO TABS: 10.0000 mg | ORAL_TABLET | Freq: Every day | ORAL | Status: DC

## 2015-08-28 NOTE — Progress Notes (Signed)
Subjective:  Presents for recheck on her high blood pressure and cholesterol. Compliant with medications. No chest pain shortness of breath or edema.  Objective:   BP 118/80 mmHg  Ht 5' (1.524 m)  Wt 158 lb 4 oz (71.782 kg)  BMI 30.91 kg/m2 NAD. Alert, oriented. Lungs clear. Heart regular rate rhythm. Lower extremities no edema.  Assessment:  Problem List Items Addressed This Visit      Cardiovascular and Mediastinum   Essential hypertension, benign - Primary   Relevant Medications   amLODipine (NORVASC) 10 MG tablet   lisinopril (PRINIVIL,ZESTRIL) 10 MG tablet   simvastatin (ZOCOR) 20 MG tablet   triamterene-hydrochlorothiazide (DYAZIDE) 37.5-25 MG capsule     Other   Hyperlipidemia LDL goal <70   Relevant Medications   amLODipine (NORVASC) 10 MG tablet   lisinopril (PRINIVIL,ZESTRIL) 10 MG tablet   simvastatin (ZOCOR) 20 MG tablet   triamterene-hydrochlorothiazide (DYAZIDE) 37.5-25 MG capsule   Other Relevant Orders   Lipid panel   Vitamin D insufficiency   Relevant Orders   Vit D  25 hydroxy (rtn osteoporosis monitoring)    Other Visit Diagnoses    High risk medication use        Relevant Orders    Hepatic function panel      Plan:  Meds ordered this encounter  Medications  . amLODipine (NORVASC) 10 MG tablet    Sig: Take 1 tablet (10 mg total) by mouth daily.    Dispense:  90 tablet    Refill:  1    Order Specific Question:  Supervising Provider    Answer:  Merlyn Albert [[id number]]  . lisinopril (PRINIVIL,ZESTRIL) 10 MG tablet    Sig: Take 1 tablet (10 mg total) by mouth at bedtime.    Dispense:  90 tablet    Refill:  1    Order Specific Question:  Supervising Provider    Answer:  Merlyn Albert [[id number]]  . simvastatin (ZOCOR) 20 MG tablet    Sig: Take 1 tablet (20 mg total) by mouth every evening.    Dispense:  90 tablet    Refill:  1    Order Specific Question:  Supervising Provider    Answer:  Merlyn Albert [[id number]]  .  triamterene-hydrochlorothiazide (DYAZIDE) 37.5-25 MG capsule    Sig: Take 1 each (1 capsule total) by mouth every morning.    Dispense:  90 capsule    Refill:  1    Order Specific Question:  Supervising Provider    Answer:  Merlyn Albert [[id number]]   Continue current medications. Lab work pending. Defers flu vaccine. Return in about 6 months (around 02/25/2016) for physical.

## 2015-08-29 LAB — HEPATIC FUNCTION PANEL
ALT: 18 IU/L (ref 0–32)
AST: 20 IU/L (ref 0–40)
Albumin: 4.2 g/dL (ref 3.5–5.5)
Alkaline Phosphatase: 67 IU/L (ref 39–117)
Bilirubin Total: 0.6 mg/dL (ref 0.0–1.2)
Bilirubin, Direct: 0.18 mg/dL (ref 0.00–0.40)
Total Protein: 7.1 g/dL (ref 6.0–8.5)

## 2015-08-29 LAB — LIPID PANEL
CHOL/HDL RATIO: 1.9 ratio (ref 0.0–4.4)
CHOLESTEROL TOTAL: 231 mg/dL — AB (ref 100–199)
HDL: 119 mg/dL (ref >39)
LDL CALC: 103 mg/dL — AB (ref 0–99)
TRIGLYCERIDES: 45 mg/dL (ref 0–149)
VLDL CHOLESTEROL CAL: 9 mg/dL (ref 5–40)

## 2015-08-29 LAB — VITAMIN D 25 HYDROXY (VIT D DEFICIENCY, FRACTURES): VIT D 25 HYDROXY: 20.7 ng/mL — AB (ref 30.0–100.0)

## 2015-09-01 ENCOUNTER — Other Ambulatory Visit: Payer: Self-pay | Admitting: Nurse Practitioner

## 2015-09-01 DIAGNOSIS — E559 Vitamin D deficiency, unspecified: Secondary | ICD-10-CM

## 2015-09-01 MED ORDER — VITAMIN D (ERGOCALCIFEROL) 1.25 MG (50000 UNIT) PO CAPS: 50000.0000 [IU] | ORAL_CAPSULE | ORAL | Status: DC

## 2015-09-08 ENCOUNTER — Other Ambulatory Visit: Payer: Self-pay | Admitting: Family Medicine

## 2015-09-08 NOTE — Telephone Encounter (Signed)
Patient was in 9/29 seen Erin Foster and forgot to get refill on medications amlodipine 10 mg she is completely out of,simvastatin  [facility]

## 2015-09-08 NOTE — Telephone Encounter (Signed)
Left message on voicemail notifying patient that medications were sent in by [facility] on 08/28/15

## 2015-09-22 ENCOUNTER — Ambulatory Visit: Payer: BLUE CROSS/BLUE SHIELD | Admitting: Nurse Practitioner

## 2015-10-27 ENCOUNTER — Other Ambulatory Visit: Payer: Self-pay | Admitting: *Deleted

## 2015-10-27 MED ORDER — AMLODIPINE BESYLATE 10 MG PO TABS: 10.0000 mg | ORAL_TABLET | Freq: Every day | ORAL | Status: DC

## 2015-10-27 MED ORDER — SIMVASTATIN 20 MG PO TABS: 20.0000 mg | ORAL_TABLET | Freq: Every evening | ORAL | Status: DC

## 2015-10-27 MED ORDER — LISINOPRIL 10 MG PO TABS: 10.0000 mg | ORAL_TABLET | Freq: Every day | ORAL | Status: DC

## 2015-10-27 MED ORDER — TRIAMTERENE-HCTZ 37.5-25 MG PO CAPS: 1.0000 | ORAL_CAPSULE | Freq: Every morning | ORAL | Status: DC

## 2015-11-21 ENCOUNTER — Other Ambulatory Visit: Payer: Self-pay | Admitting: Family Medicine

## 2015-11-21 DIAGNOSIS — Z1231 Encounter for screening mammogram for malignant neoplasm of breast: Secondary | ICD-10-CM

## 2015-11-26 ENCOUNTER — Ambulatory Visit (HOSPITAL_COMMUNITY)
Admission: RE | Admit: 2015-11-26 | Discharge: 2015-11-26 | Disposition: A | Discharge status: Home / Self Care | Payer: BLUE CROSS/BLUE SHIELD | Source: Ambulatory Visit | Attending: Family Medicine | Admitting: Family Medicine

## 2015-11-26 DIAGNOSIS — Z1231 Encounter for screening mammogram for malignant neoplasm of breast: Secondary | ICD-10-CM | POA: Diagnosis not present

## 2016-01-05 ENCOUNTER — Other Ambulatory Visit: Payer: Self-pay | Admitting: Family Medicine

## 2016-01-07 ENCOUNTER — Other Ambulatory Visit: Payer: Self-pay

## 2016-01-07 MED ORDER — LISINOPRIL 10 MG PO TABS: 10.0000 mg | ORAL_TABLET | Freq: Every day | ORAL | Status: DC

## 2016-01-23 ENCOUNTER — Other Ambulatory Visit: Payer: Self-pay | Admitting: Family Medicine

## 2016-02-05 ENCOUNTER — Ambulatory Visit (INDEPENDENT_AMBULATORY_CARE_PROVIDER_SITE_OTHER): Payer: BLUE CROSS/BLUE SHIELD | Admitting: Nurse Practitioner

## 2016-02-05 ENCOUNTER — Encounter: Payer: Self-pay | Admitting: Nurse Practitioner

## 2016-02-05 VITALS — BP 128/86 | Temp 98.0°F | Ht 60.0 in | Wt 161.0 lb

## 2016-02-05 DIAGNOSIS — E785 Hyperlipidemia, unspecified: Secondary | ICD-10-CM

## 2016-02-05 DIAGNOSIS — Z79899 Other long term (current) drug therapy: Secondary | ICD-10-CM

## 2016-02-05 DIAGNOSIS — J310 Chronic rhinitis: Secondary | ICD-10-CM | POA: Diagnosis not present

## 2016-02-05 DIAGNOSIS — I10 Essential (primary) hypertension: Secondary | ICD-10-CM | POA: Diagnosis not present

## 2016-02-05 MED ORDER — TRIAMTERENE-HCTZ 37.5-25 MG PO CAPS: 1.0000 | ORAL_CAPSULE | Freq: Every morning | ORAL | Status: DC

## 2016-02-05 MED ORDER — AMLODIPINE BESYLATE 10 MG PO TABS: 10.0000 mg | ORAL_TABLET | Freq: Every day | ORAL | Status: DC

## 2016-02-05 MED ORDER — SIMVASTATIN 20 MG PO TABS: 20.0000 mg | ORAL_TABLET | Freq: Every evening | ORAL | Status: DC

## 2016-02-05 MED ORDER — LISINOPRIL 10 MG PO TABS: 10.0000 mg | ORAL_TABLET | Freq: Every day | ORAL | Status: DC

## 2016-02-05 NOTE — Patient Instructions (Signed)
Restart Claritin as directed Start Flonase or Nasacort AQ as directed

## 2016-02-05 NOTE — Progress Notes (Signed)
Subjective:  Presents for routine follow-up. No chest pain/ischemic type pain or shortness of breath. Compliant with medications, needs refills today. Also complaints of head congestion and sneezing for the past month. Postnasal drainage. No fever. Left ear pressure times. No sore throat. No headache. Minimal cough. Drainage is clear. Very active job. Has not done well with her diet lately.  Objective:   BP 128/86 mmHg  Temp(Src) 98 F (36.7 C) (Oral)  Ht 5' (1.524 m)  Wt 161 lb (73.029 kg)  BMI 31.44 kg/m2 NAD. Alert, oriented. TMs mildly retracted, no erythema. Pharynx minimally injected, clear PND noted. Neck supple with mild soft anterior adenopathy. Lungs clear. Heart regular rate rhythm. Lower extremities no edema.  Assessment:  Problem List Items Addressed This Visit      Cardiovascular and Mediastinum   Essential hypertension, benign - Primary   Relevant Medications   triamterene-hydrochlorothiazide (DYAZIDE) 37.5-25 MG capsule   simvastatin (ZOCOR) 20 MG tablet   lisinopril (PRINIVIL,ZESTRIL) 10 MG tablet   amLODipine (NORVASC) 10 MG tablet     Other   Hyperlipidemia LDL goal <70   Relevant Medications   triamterene-hydrochlorothiazide (DYAZIDE) 37.5-25 MG capsule   simvastatin (ZOCOR) 20 MG tablet   lisinopril (PRINIVIL,ZESTRIL) 10 MG tablet   amLODipine (NORVASC) 10 MG tablet   Other Relevant Orders   Lipid panel    Other Visit Diagnoses    Mixed rhinitis        High risk medication use        Relevant Orders    Hepatic function panel      Plan:  Meds ordered this encounter  Medications  . DISCONTD: amLODipine (NORVASC) 10 MG tablet    Sig: Take 1 tablet (10 mg total) by mouth daily.    Dispense:  30 tablet    Refill:  0    Order Specific Question:  Supervising Provider    Answer:  Merlyn AlbertLUKING, WILLIAM S [[id number]]  . DISCONTD: triamterene-hydrochlorothiazide (DYAZIDE) 37.5-25 MG capsule    Sig: Take 1 each (1 capsule total) by mouth every morning.    Dispense:  30  capsule    Refill:  0    Order Specific Question:  Supervising Provider    Answer:  Merlyn AlbertLUKING, WILLIAM S [[id number]]  . triamterene-hydrochlorothiazide (DYAZIDE) 37.5-25 MG capsule    Sig: Take 1 each (1 capsule total) by mouth every morning.    Dispense:  90 capsule    Refill:  1    Order Specific Question:  Supervising Provider    Answer:  Merlyn AlbertLUKING, WILLIAM S [[id number]]  . simvastatin (ZOCOR) 20 MG tablet    Sig: Take 1 tablet (20 mg total) by mouth every evening.    Dispense:  90 tablet    Refill:  1    Order Specific Question:  Supervising Provider    Answer:  Merlyn AlbertLUKING, WILLIAM S [[id number]]  . lisinopril (PRINIVIL,ZESTRIL) 10 MG tablet    Sig: Take 1 tablet (10 mg total) by mouth at bedtime.    Dispense:  90 tablet    Refill:  1    Order Specific Question:  Supervising Provider    Answer:  Merlyn AlbertLUKING, WILLIAM S [[id number]]  . amLODipine (NORVASC) 10 MG tablet    Sig: Take 1 tablet (10 mg total) by mouth daily.    Dispense:  90 tablet    Refill:  1    Order Specific Question:  Supervising Provider    Answer:  Merlyn AlbertLUKING, WILLIAM S [[id number]]   Recommend low-fat, low-cholesterol diet. Our goal  for her LDL is around 70. Continue medications as directed. Repeat lipid and liver profiles in April. Follow-up at yearly preventive health physical. 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 Recommend naproxen as directed. Patient defers any further action at this time.OTC meds as directed for congestion. Limit exposure to pollen as much as possible.

## 2016-03-27 DIAGNOSIS — Z79899 Other long term (current) drug therapy: Secondary | ICD-10-CM | POA: Diagnosis not present

## 2016-03-27 DIAGNOSIS — E785 Hyperlipidemia, unspecified: Secondary | ICD-10-CM | POA: Diagnosis not present

## 2016-03-28 LAB — HEPATIC FUNCTION PANEL
ALBUMIN: 4 g/dL (ref 3.5–5.5)
ALT: 14 IU/L (ref 0–32)
AST: 19 IU/L (ref 0–40)
Alkaline Phosphatase: 63 IU/L (ref 39–117)
BILIRUBIN, DIRECT: 0.14 mg/dL (ref 0.00–0.40)
Bilirubin Total: 0.5 mg/dL (ref 0.0–1.2)
Total Protein: 6.8 g/dL (ref 6.0–8.5)

## 2016-03-28 LAB — LIPID PANEL
CHOL/HDL RATIO: 2.2 ratio (ref 0.0–4.4)
Cholesterol, Total: 224 mg/dL — ABNORMAL HIGH (ref 100–199)
HDL: 102 mg/dL (ref >39)
LDL Calculated: 110 mg/dL — ABNORMAL HIGH (ref 0–99)
TRIGLYCERIDES: 62 mg/dL (ref 0–149)
VLDL Cholesterol Cal: 12 mg/dL (ref 5–40)

## 2016-03-29 ENCOUNTER — Encounter: Payer: BLUE CROSS/BLUE SHIELD | Admitting: Nurse Practitioner

## 2016-04-13 DIAGNOSIS — E669 Obesity, unspecified: Secondary | ICD-10-CM | POA: Diagnosis not present

## 2016-04-13 DIAGNOSIS — Z6831 Body mass index (BMI) 31.0-31.9, adult: Secondary | ICD-10-CM | POA: Diagnosis not present

## 2016-04-16 ENCOUNTER — Encounter: Payer: BLUE CROSS/BLUE SHIELD | Admitting: Nurse Practitioner

## 2016-04-23 ENCOUNTER — Ambulatory Visit (INDEPENDENT_AMBULATORY_CARE_PROVIDER_SITE_OTHER): Payer: BLUE CROSS/BLUE SHIELD | Admitting: Nurse Practitioner

## 2016-04-23 ENCOUNTER — Encounter: Payer: Self-pay | Admitting: Nurse Practitioner

## 2016-04-23 VITALS — BP 122/82 | Ht 60.0 in | Wt 160.6 lb

## 2016-04-23 DIAGNOSIS — Z0000 Encounter for general adult medical examination without abnormal findings: Secondary | ICD-10-CM

## 2016-04-23 DIAGNOSIS — Z01419 Encounter for gynecological examination (general) (routine) without abnormal findings: Secondary | ICD-10-CM

## 2016-04-24 ENCOUNTER — Encounter: Payer: Self-pay | Admitting: Nurse Practitioner

## 2016-04-24 NOTE — Progress Notes (Signed)
   Subjective:    Patient ID: Erin Foster, female    DOB: [birth year], 60 y.o.   MRN: [id number]  HPI presents for her wellness exam. Same sexual partner. No vaginal bleeding or pelvic pain. Needs vision exam. Regular dental exams. Walking 2-3 days per week for activity.     Review of Systems  Constitutional: Negative for activity change, appetite change and fatigue.  HENT: Negative for dental problem, ear pain, sinus pressure and sore throat.   Respiratory: Negative for cough, chest tightness, shortness of breath and wheezing.   Cardiovascular: Negative for chest pain.  Gastrointestinal: Positive for constipation. Negative for nausea, vomiting, abdominal pain, diarrhea, blood in stool and abdominal distention.       Occasional constipation which usually responds well to prune juice.   Genitourinary: Negative for dysuria, urgency, frequency, vaginal bleeding, vaginal discharge, enuresis, difficulty urinating, genital sores and pelvic pain.       Objective:   Physical Exam  Constitutional: She is oriented to person, place, and time. She appears well-developed. No distress.  HENT:  Right Ear: External ear normal.  Left Ear: External ear normal.  Mouth/Throat: Oropharynx is clear and moist.  Neck: Normal range of motion. Neck supple. No tracheal deviation present. No thyromegaly present.  Cardiovascular: Normal rate, regular rhythm and normal heart sounds.  Exam reveals no gallop.   No murmur heard. Pulmonary/Chest: Effort normal and breath sounds normal.  Abdominal: Soft. She exhibits no distension. There is no tenderness.  Genitourinary: Vagina normal and uterus normal. No vaginal discharge found.  External GU: no rashes or lesions. Vagina: no discharge. Bimanual exam: no tenderness or obvious masses. Cervix normal in appearance. No CMT. Rectal exam: no masses; no stool for hemoccult.   Musculoskeletal: She exhibits no edema.  Lymphadenopathy:    She has no cervical adenopathy.    Neurological: She is alert and oriented to person, place, and time.  Skin: Skin is warm and dry. No rash noted.  Psychiatric: She has a normal mood and affect. Her behavior is normal.  Vitals reviewed.  Breast exam: areas of density; no masses; axillae no adenopathy.        Assessment & Plan:  Well woman exam - Plan: POC Hemoccult Bld/Stl (3-Cd Home Screen)  Recommend regular activity and daily vitamin D/calcium supplementation. Reviewed labs with patient from 03/27/16. LDL is increasing. Recommend low fat, low cholesterol diet.  Return in about 6 months (around 10/24/2016) for recheck.

## 2016-04-27 DIAGNOSIS — Z6831 Body mass index (BMI) 31.0-31.9, adult: Secondary | ICD-10-CM | POA: Diagnosis not present

## 2016-04-27 DIAGNOSIS — E669 Obesity, unspecified: Secondary | ICD-10-CM | POA: Diagnosis not present

## 2016-05-18 DIAGNOSIS — E669 Obesity, unspecified: Secondary | ICD-10-CM | POA: Diagnosis not present

## 2016-05-18 DIAGNOSIS — Z6831 Body mass index (BMI) 31.0-31.9, adult: Secondary | ICD-10-CM | POA: Diagnosis not present

## 2016-05-27 DIAGNOSIS — E669 Obesity, unspecified: Secondary | ICD-10-CM | POA: Diagnosis not present

## 2016-05-27 DIAGNOSIS — Z6830 Body mass index (BMI) 30.0-30.9, adult: Secondary | ICD-10-CM | POA: Diagnosis not present

## 2016-06-10 DIAGNOSIS — Z6831 Body mass index (BMI) 31.0-31.9, adult: Secondary | ICD-10-CM | POA: Diagnosis not present

## 2016-06-10 DIAGNOSIS — E669 Obesity, unspecified: Secondary | ICD-10-CM | POA: Diagnosis not present

## 2016-06-24 DIAGNOSIS — E669 Obesity, unspecified: Secondary | ICD-10-CM | POA: Diagnosis not present

## 2016-06-24 DIAGNOSIS — I10 Essential (primary) hypertension: Secondary | ICD-10-CM | POA: Diagnosis not present

## 2016-06-24 DIAGNOSIS — Z008 Encounter for other general examination: Secondary | ICD-10-CM | POA: Diagnosis not present

## 2016-06-24 DIAGNOSIS — E785 Hyperlipidemia, unspecified: Secondary | ICD-10-CM | POA: Diagnosis not present

## 2016-06-25 ENCOUNTER — Other Ambulatory Visit: Payer: Self-pay | Admitting: Nurse Practitioner

## 2016-07-06 DIAGNOSIS — E669 Obesity, unspecified: Secondary | ICD-10-CM | POA: Diagnosis not present

## 2016-07-06 DIAGNOSIS — Z6830 Body mass index (BMI) 30.0-30.9, adult: Secondary | ICD-10-CM | POA: Diagnosis not present

## 2016-07-22 DIAGNOSIS — E669 Obesity, unspecified: Secondary | ICD-10-CM | POA: Diagnosis not present

## 2016-07-22 DIAGNOSIS — Z6830 Body mass index (BMI) 30.0-30.9, adult: Secondary | ICD-10-CM | POA: Diagnosis not present

## 2016-08-10 DIAGNOSIS — Z6830 Body mass index (BMI) 30.0-30.9, adult: Secondary | ICD-10-CM | POA: Diagnosis not present

## 2016-08-10 DIAGNOSIS — E669 Obesity, unspecified: Secondary | ICD-10-CM | POA: Diagnosis not present

## 2016-08-24 DIAGNOSIS — Z6830 Body mass index (BMI) 30.0-30.9, adult: Secondary | ICD-10-CM | POA: Diagnosis not present

## 2016-08-24 DIAGNOSIS — E669 Obesity, unspecified: Secondary | ICD-10-CM | POA: Diagnosis not present

## 2016-09-14 ENCOUNTER — Ambulatory Visit: Payer: BLUE CROSS/BLUE SHIELD | Admitting: Family Medicine

## 2016-09-20 ENCOUNTER — Ambulatory Visit (INDEPENDENT_AMBULATORY_CARE_PROVIDER_SITE_OTHER): Payer: BLUE CROSS/BLUE SHIELD | Admitting: Family Medicine

## 2016-09-20 ENCOUNTER — Encounter: Payer: Self-pay | Admitting: Family Medicine

## 2016-09-20 VITALS — BP 112/76 | Ht 60.0 in | Wt 156.0 lb

## 2016-09-20 DIAGNOSIS — J310 Chronic rhinitis: Secondary | ICD-10-CM | POA: Diagnosis not present

## 2016-09-20 DIAGNOSIS — Z79899 Other long term (current) drug therapy: Secondary | ICD-10-CM | POA: Diagnosis not present

## 2016-09-20 DIAGNOSIS — I10 Essential (primary) hypertension: Secondary | ICD-10-CM

## 2016-09-20 DIAGNOSIS — E785 Hyperlipidemia, unspecified: Secondary | ICD-10-CM | POA: Diagnosis not present

## 2016-09-20 MED ORDER — LISINOPRIL 5 MG PO TABS: 5.0000 mg | ORAL_TABLET | Freq: Every day | ORAL | 1 refills | Status: DC

## 2016-09-20 MED ORDER — AMLODIPINE BESYLATE 10 MG PO TABS: 10.0000 mg | ORAL_TABLET | Freq: Every day | ORAL | 1 refills | Status: DC

## 2016-09-20 MED ORDER — TRIAMTERENE-HCTZ 37.5-25 MG PO CAPS: 1.0000 | ORAL_CAPSULE | Freq: Every morning | ORAL | 1 refills | Status: DC

## 2016-09-20 MED ORDER — SIMVASTATIN 20 MG PO TABS: 20.0000 mg | ORAL_TABLET | Freq: Every evening | ORAL | 1 refills | Status: DC

## 2016-09-20 NOTE — Progress Notes (Signed)
   Subjective:    Patient ID: Erin Foster, female    DOB: 21-Apr-1956, 60 y.o.   MRN: [id number]  Hyperlipidemia  This is a chronic problem. The current episode started more than 1 year ago. Treatments tried: simvastatin. There are no compliance problems (walks for exercise, tries to eat healthy).    Declines flu vaccine  Staying busy with work  Patient continues to take lipid medication regularly. No obvious side effects from it. Generally does not miss a dose. Prior blood work results are reviewed with patient. Patient continues to work on fat intake in diet  Blood pressure medicine and blood pressure levels reviewed today with patient. Compliant with blood pressure medicine. States does not miss a dose. No obvious side effects. Blood pressure generally good when checked elsewhere. Watching salt intake.  Walking reg, walking about every day,   Long history of congestion drainage runny nose. Seems to be getting worse. Seasonal aspect but also year-round has  Review of Systems No headache, no major weight loss or weight gain, no chest pain no back pain abdominal pain no change in bowel habits complete ROS otherwise negative     Objective:   Physical Exam Alert vitals stable, NAD. Blood pressure 106/70 bilateral on repeat. HEENT moderate nasal congestion slight discharge otherwise normal. Lungs clear. Heart regular rate and rhythm.        Assessment & Plan:  Impression hypertension good control in fact numbers too tight, will back off on enalapril dose. #2 hyperlipidemia prior blood work discussed blood work discussed meds refilled 6 months worth #3 allergic rhinitis suboptimum control with patient not use medication. Encouraged to resume Zyrtec. And reason Flonase. Rationale discussed. diet exercise discussed encourage

## 2016-09-21 LAB — HEPATIC FUNCTION PANEL
ALBUMIN: 4.1 g/dL (ref 3.5–5.5)
ALT: 31 IU/L (ref 0–32)
AST: 24 IU/L (ref 0–40)
Alkaline Phosphatase: 75 IU/L (ref 39–117)
Bilirubin Total: 0.6 mg/dL (ref 0.0–1.2)
Bilirubin, Direct: 0.15 mg/dL (ref 0.00–0.40)
TOTAL PROTEIN: 6.9 g/dL (ref 6.0–8.5)

## 2016-09-21 LAB — LIPID PANEL
CHOL/HDL RATIO: 2.2 ratio (ref 0.0–4.4)
Cholesterol, Total: 243 mg/dL — ABNORMAL HIGH (ref 100–199)
HDL: 109 mg/dL (ref >39)
LDL Calculated: 123 mg/dL — ABNORMAL HIGH (ref 0–99)
Triglycerides: 55 mg/dL (ref 0–149)
VLDL CHOLESTEROL CAL: 11 mg/dL (ref 5–40)

## 2016-09-23 ENCOUNTER — Encounter: Payer: Self-pay | Admitting: Family Medicine

## 2016-09-23 ENCOUNTER — Ambulatory Visit (INDEPENDENT_AMBULATORY_CARE_PROVIDER_SITE_OTHER): Payer: BLUE CROSS/BLUE SHIELD | Admitting: Family Medicine

## 2016-09-23 VITALS — BP 120/80 | Temp 97.9°F | Ht 60.0 in | Wt 157.5 lb

## 2016-09-23 DIAGNOSIS — W57XXXA Bitten or stung by nonvenomous insect and other nonvenomous arthropods, initial encounter: Secondary | ICD-10-CM

## 2016-09-23 DIAGNOSIS — E669 Obesity, unspecified: Secondary | ICD-10-CM | POA: Diagnosis not present

## 2016-09-23 DIAGNOSIS — Z6830 Body mass index (BMI) 30.0-30.9, adult: Secondary | ICD-10-CM | POA: Diagnosis not present

## 2016-09-23 DIAGNOSIS — T783XXA Angioneurotic edema, initial encounter: Secondary | ICD-10-CM

## 2016-09-23 MED ORDER — HYDROXYZINE HCL 25 MG PO TABS: 25.0000 mg | ORAL_TABLET | Freq: Three times a day (TID) | ORAL | 0 refills | Status: DC | PRN

## 2016-09-23 MED ORDER — METHYLPREDNISOLONE ACETATE 40 MG/ML IJ SUSP
40.0000 mg | Freq: Once | INTRAMUSCULAR | Status: AC
  Administered 2016-09-23: 40 mg via INTRAMUSCULAR

## 2016-09-23 MED ORDER — PREDNISONE 20 MG PO TABS: ORAL_TABLET | ORAL | 0 refills | Status: DC

## 2016-09-23 NOTE — Progress Notes (Signed)
   Subjective:    Patient ID: Erin Foster, female    DOB: May 19, 1956, 60 y.o.   MRN: [id number]  HPI Patient in today for possible insect bite. Has swelling and itching to left arm and left upper leg. Onset of symptoms Tuesday night.  She states she feels she was bit by an insect to her left arm in the left upper thigh she started having intense itching and swelling around. There is no hives associated with it. She is never having problem like this before and does not have any hives or issues like that currently denies high fever chills or sweats States no other concerns this visit.    Review of Systems Denies cough denies difficulty swallowing or breathing. 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 Prednisone taper Depo-Medrol shot, antihistamines caution drowsiness, follow-up if ongoing troubles

## 2016-09-27 ENCOUNTER — Encounter: Payer: Self-pay | Admitting: Family Medicine

## 2016-10-12 DIAGNOSIS — Z6830 Body mass index (BMI) 30.0-30.9, adult: Secondary | ICD-10-CM | POA: Diagnosis not present

## 2016-10-12 DIAGNOSIS — E669 Obesity, unspecified: Secondary | ICD-10-CM | POA: Diagnosis not present

## 2016-11-24 ENCOUNTER — Other Ambulatory Visit: Payer: Self-pay | Admitting: Family Medicine

## 2016-11-24 DIAGNOSIS — Z1231 Encounter for screening mammogram for malignant neoplasm of breast: Secondary | ICD-10-CM

## 2016-12-03 ENCOUNTER — Ambulatory Visit (HOSPITAL_COMMUNITY)
Admission: RE | Admit: 2016-12-03 | Discharge: 2016-12-03 | Disposition: A | Discharge status: Home / Self Care | Payer: BLUE CROSS/BLUE SHIELD | Source: Ambulatory Visit | Attending: Family Medicine | Admitting: Family Medicine

## 2016-12-03 DIAGNOSIS — N6489 Other specified disorders of breast: Secondary | ICD-10-CM | POA: Diagnosis not present

## 2016-12-03 DIAGNOSIS — Z1231 Encounter for screening mammogram for malignant neoplasm of breast: Secondary | ICD-10-CM | POA: Diagnosis not present

## 2016-12-20 ENCOUNTER — Encounter (HOSPITAL_COMMUNITY): Payer: Self-pay | Admitting: Emergency Medicine

## 2016-12-20 ENCOUNTER — Emergency Department (HOSPITAL_COMMUNITY)
Admission: EM | Admit: 2016-12-20 | Discharge: 2016-12-20 | Disposition: A | Discharge status: Home / Self Care | Payer: BLUE CROSS/BLUE SHIELD | Attending: Emergency Medicine | Admitting: Emergency Medicine

## 2016-12-20 DIAGNOSIS — J111 Influenza due to unidentified influenza virus with other respiratory manifestations: Secondary | ICD-10-CM | POA: Diagnosis not present

## 2016-12-20 DIAGNOSIS — R05 Cough: Secondary | ICD-10-CM | POA: Insufficient documentation

## 2016-12-20 DIAGNOSIS — H9209 Otalgia, unspecified ear: Secondary | ICD-10-CM | POA: Insufficient documentation

## 2016-12-20 DIAGNOSIS — R69 Illness, unspecified: Secondary | ICD-10-CM

## 2016-12-20 DIAGNOSIS — M791 Myalgia: Secondary | ICD-10-CM | POA: Diagnosis not present

## 2016-12-20 DIAGNOSIS — Z79899 Other long term (current) drug therapy: Secondary | ICD-10-CM | POA: Insufficient documentation

## 2016-12-20 DIAGNOSIS — R0982 Postnasal drip: Secondary | ICD-10-CM | POA: Insufficient documentation

## 2016-12-20 DIAGNOSIS — I10 Essential (primary) hypertension: Secondary | ICD-10-CM | POA: Insufficient documentation

## 2016-12-20 DIAGNOSIS — J3489 Other specified disorders of nose and nasal sinuses: Secondary | ICD-10-CM | POA: Diagnosis not present

## 2016-12-20 DIAGNOSIS — Z7982 Long term (current) use of aspirin: Secondary | ICD-10-CM | POA: Diagnosis not present

## 2016-12-20 DIAGNOSIS — J45909 Unspecified asthma, uncomplicated: Secondary | ICD-10-CM | POA: Insufficient documentation

## 2016-12-20 DIAGNOSIS — R0981 Nasal congestion: Secondary | ICD-10-CM | POA: Insufficient documentation

## 2016-12-20 MED ORDER — ACETAMINOPHEN 325 MG PO TABS: 650.0000 mg | ORAL_TABLET | Freq: Four times a day (QID) | ORAL | 0 refills | Status: DC | PRN

## 2016-12-20 MED ORDER — CETIRIZINE HCL 10 MG PO TABS: 10.0000 mg | ORAL_TABLET | Freq: Every day | ORAL | 1 refills | Status: DC

## 2016-12-20 MED ORDER — BENZONATATE 100 MG PO CAPS: 100.0000 mg | ORAL_CAPSULE | Freq: Three times a day (TID) | ORAL | 0 refills | Status: DC | PRN

## 2016-12-20 MED ORDER — FLUTICASONE PROPIONATE 50 MCG/ACT NA SUSP: 2.0000 | Freq: Every day | NASAL | 0 refills | Status: DC

## 2016-12-20 NOTE — ED Provider Notes (Signed)
[facility]-EMERGENCY DEPT Provider Note   CSN: [id number] Arrival date & time: 12/20/16  1002   By signing my name below, I, [name], attest that this documentation has been prepared under the direction and in the presence of Everlene FarrierWilliam Bradin Mcadory, PA-C.  Electronically Signed: [name], Scribe. 12/20/16. 12:36 PM.  History   Chief Complaint Chief Complaint  Patient presents with  . Generalized Body Aches   HPI Comments: Erin GageBrenda Foster is a 61 y.o. female who presents to the Emergency Department complaining of gradually worsening body aches, nasal congestion, productive cough, rhinorrhea, postnasal drip, and ear pain. She denies any fevers. Patient states she did not have a flu shot this year. No treatments prior to arrival today. No modifying factors indicated. She denies any vomiting, diarrhea, fever, or trouble swallowing.   The history is provided by the patient. No language interpreter was used.    Past Medical History:  Diagnosis Date  . Asthma   . CVA (cerebral vascular accident) (HCC)   . Hyperlipidemia   . Hypertension     Patient Active Problem List   Diagnosis Date Noted  . Postmenopausal bleeding 12/30/2013  . Vitamin D insufficiency 12/30/2013  . Essential hypertension, benign 07/24/2013  . Hyperlipidemia LDL goal <70 07/24/2013  . History of CVA (cerebrovascular accident) 07/24/2013    Past Surgical History:  Procedure Laterality Date  . COLONOSCOPY      OB History    No data available       Home Medications    Prior to Admission medications   Medication Sig Start Date End Date Taking? Authorizing Provider  amLODipine (NORVASC) 10 MG tablet Take 1 tablet (10 mg total) by mouth daily. 09/20/16  Yes Merlyn AlbertWilliam S Luking, MD  aspirin 81 MG tablet Take 81 mg by mouth daily.   Yes Historical Provider, MD  hydrOXYzine (ATARAX/VISTARIL) 25 MG tablet Take 1 tablet (25 mg total) by mouth 3 (three) times daily as needed. 09/23/16  Yes Babs SciaraScott A Luking, MD    lisinopril (PRINIVIL,ZESTRIL) 5 MG tablet Take 1 tablet (5 mg total) by mouth daily. 09/20/16  Yes Merlyn AlbertWilliam S Luking, MD  simvastatin (ZOCOR) 20 MG tablet Take 1 tablet (20 mg total) by mouth every evening. 09/20/16  Yes Merlyn AlbertWilliam S Luking, MD  triamterene-hydrochlorothiazide (DYAZIDE) 37.5-25 MG capsule Take 1 each (1 capsule total) by mouth every morning. 09/20/16  Yes Merlyn AlbertWilliam S Luking, MD  acetaminophen (TYLENOL) 325 MG tablet Take 2 tablets (650 mg total) by mouth every 6 (six) hours as needed for mild pain, moderate pain or fever. 12/20/16   Everlene FarrierWilliam Eldor Conaway, PA-C  benzonatate (TESSALON) 100 MG capsule Take 1 capsule (100 mg total) by mouth 3 (three) times daily as needed for cough. 12/20/16   Everlene FarrierWilliam Yandell Mcjunkins, PA-C  cetirizine (ZYRTEC ALLERGY) 10 MG tablet Take 1 tablet (10 mg total) by mouth daily. 12/20/16   Everlene FarrierWilliam Annalysia Willenbring, PA-C  fluticasone (FLONASE) 50 MCG/ACT nasal spray Place 2 sprays into both nostrils daily. 12/20/16   Everlene FarrierWilliam Emre Stock, PA-C  predniSONE (DELTASONE) 20 MG tablet 3qd for 2d then 2qd for 2d then 1qd for 2d Patient not taking: Reported on 12/20/2016 09/23/16   Babs SciaraScott A Luking, MD    Family History Family History  Problem Relation Age of Onset  . Diabetes Mother   . Hypertension Mother   . Hyperlipidemia Mother   . Cancer Maternal Aunt 50    breast  . Hypertension Sister   . Hypertension Sister     Social History Social History  Substance Use Topics  . Smoking status: Never Smoker  . Smokeless tobacco: Never Used  . Alcohol use No     Allergies   Patient has no known allergies.   Review of Systems Review of Systems  Constitutional: Negative for chills and fever.  HENT: Positive for congestion, ear pain, postnasal drip and rhinorrhea. Negative for sore throat and trouble swallowing.   Eyes: Negative for visual disturbance.  Respiratory: Positive for cough. Negative for shortness of breath.   Cardiovascular: Negative for chest pain.  Gastrointestinal: Negative  for abdominal pain, diarrhea and vomiting.  Musculoskeletal: Positive for myalgias.  Skin: Negative for rash.  Neurological: Negative for syncope and light-headedness.     Physical Exam Updated Vital Signs BP 109/61 (BP Location: Right Arm)   Pulse 67   Temp 99 F (37.2 C) (Oral)   Resp 18   Ht 5' (1.524 m)   Wt 68 kg   SpO2 98%   BMI 29.29 kg/m   Physical Exam  Constitutional: She appears well-developed and well-nourished. No distress.  Non-toxic appearing.   HENT:  Head: Normocephalic and atraumatic.  Right Ear: External ear normal.  Left Ear: External ear normal.  Mouth/Throat: Oropharynx is clear and moist. No oropharyngeal exudate.  Boggy nasal turbinate bilaterally.  Mild middle ear effusion bilaterally with no erythema or loss of landmarks.  No tonsillar hypertrophy or exudates.  Eyes: Conjunctivae are normal. Pupils are equal, round, and reactive to light. Right eye exhibits no discharge. Left eye exhibits no discharge.  Neck: Neck supple.  Cardiovascular: Normal rate, regular rhythm, normal heart sounds and intact distal pulses.   Pulmonary/Chest: Effort normal and breath sounds normal. No respiratory distress. She has no wheezes. She has no rales.  Lungs are clear to ascultation bilaterally.   Abdominal: Soft. There is no tenderness. There is no guarding.  Lymphadenopathy:    She has no cervical adenopathy.  Neurological: She is alert. Coordination normal.  Skin: Skin is warm and dry. Capillary refill takes less than 2 seconds. No rash noted. She is not diaphoretic. No erythema. No pallor.  Psychiatric: She has a normal mood and affect. Her behavior is normal.  Nursing note and vitals reviewed.    ED Treatments / Results  DIAGNOSTIC STUDIES: Oxygen Saturation is 100% on RA, normal by my interpretation.    COORDINATION OF CARE: 12:37 PM Discussed treatment plan with pt at bedside and pt agreed to plan.  Labs (all labs ordered are listed, but only  abnormal results are displayed) Labs Reviewed - No data to display  EKG  EKG Interpretation None       Radiology No results found.  Procedures Procedures (including critical care time)  Medications Ordered in ED Medications - No data to display   Initial Impression / Assessment and Plan / ED Course  I have reviewed the triage vital signs and the nursing notes.  Pertinent labs & imaging results that were available during my care of the patient were reviewed by me and considered in my medical decision making (see chart for details).   This is a 61 y.o. female who presents to the Emergency Department complaining of gradually worsening body aches, nasal congestion, productive cough, rhinorrhea, postnasal drip, and ear pain. She denies any fevers. Patient states she did not have a flu shot this year. No treatments prior to arrival today. On exam the patient is afebrile nontoxic appearing. She has mild middle ear effusion bilaterally. Boggy nasal turbinates. Throat is clear. Lungs clear to  auscultation bilaterally. No increased work of breathing. Patient likely with flulike illness. She is out of the 48 hour window for Tamiflu. We'll discharge with prescriptions for Tylenol, Flonase, Zyrtec and Tessalon Perles. I discussed strict and specific return precautions. I discussed the expected course and treatment of influenza. I advised the patient to follow-up with their primary care provider this week. I advised the patient to return to the emergency department with new or worsening symptoms or new concerns. The patient verbalized understanding and agreement with plan.     Final Clinical Impressions(s) / ED Diagnoses   Final diagnoses:  Influenza-like illness    New Prescriptions Discharge Medication List as of 12/20/2016 12:39 PM    START taking these medications   Details  acetaminophen (TYLENOL) 325 MG tablet Take 2 tablets (650 mg total) by mouth every 6 (six) hours as needed for  mild pain, moderate pain or fever., Starting Mon 12/20/2016, Print    benzonatate (TESSALON) 100 MG capsule Take 1 capsule (100 mg total) by mouth 3 (three) times daily as needed for cough., Starting Mon 12/20/2016, Print    cetirizine (ZYRTEC ALLERGY) 10 MG tablet Take 1 tablet (10 mg total) by mouth daily., Starting Mon 12/20/2016, Print    fluticasone (FLONASE) 50 MCG/ACT nasal spray Place 2 sprays into both nostrils daily., Starting Mon 12/20/2016, Print       I personally performed the services described in this documentation, which was scribed in my presence. The recorded information has been reviewed and is accurate.      Everlene Farrier, PA-C 12/20/16 1247    Geoffery Lyons, MD 12/20/16 [phone]

## 2016-12-20 NOTE — ED Triage Notes (Signed)
Pt c/o flu-like symptoms since Thursday. Pt c/o body aches/cough congestion. denies v/d.

## 2016-12-22 ENCOUNTER — Encounter: Payer: Self-pay | Admitting: Family Medicine

## 2016-12-22 ENCOUNTER — Ambulatory Visit (INDEPENDENT_AMBULATORY_CARE_PROVIDER_SITE_OTHER): Payer: BLUE CROSS/BLUE SHIELD | Admitting: Family Medicine

## 2016-12-22 VITALS — BP 100/70 | Temp 98.2°F | Ht 60.0 in | Wt 151.1 lb

## 2016-12-22 DIAGNOSIS — J209 Acute bronchitis, unspecified: Secondary | ICD-10-CM

## 2016-12-22 DIAGNOSIS — J019 Acute sinusitis, unspecified: Secondary | ICD-10-CM | POA: Diagnosis not present

## 2016-12-22 DIAGNOSIS — J111 Influenza due to unidentified influenza virus with other respiratory manifestations: Secondary | ICD-10-CM | POA: Diagnosis not present

## 2016-12-22 MED ORDER — AZITHROMYCIN 250 MG PO TABS: ORAL_TABLET | ORAL | 0 refills | Status: DC

## 2016-12-22 NOTE — Patient Instructions (Signed)

## 2016-12-22 NOTE — Progress Notes (Signed)
   Subjective:    Patient ID: Erin Foster, female    DOB: Mar 11, 1956, 61 y.o.   MRN: [id number]  HPI Patient is here today for a ER follow up visit. [facility] and diagnosed with influenza-like illness. Patient is experiencing back pain, cough, congestion, runny nose and wheezing. Onset 1 week ago. Patient was prescribed zyrtec, flonase and benzonatate from the hospital with no relief.   Patient with flulike illness seen in the ER. Treated released with symptomatic care. Not getting any better a lot of chest congestion coughing intermittent fevers no wheezing or difficulty breathing. Some head congestion drainage and sinus pressure  Review of Systems See above.    Objective:   Physical Exam  Eardrums normal throat normal neck supple lungs clear heart regular      Assessment & Plan:  Influenza supportive measures discuss Secondary bronchitis Secondary sinus infection Zithromax as described If progressive troubles or worse follow-up X-rays lab work not indicated currently

## 2016-12-27 ENCOUNTER — Encounter: Payer: Self-pay | Admitting: Family Medicine

## 2016-12-28 ENCOUNTER — Telehealth: Payer: Self-pay | Admitting: Family Medicine

## 2016-12-28 NOTE — Telephone Encounter (Signed)
Please extend through the end of the week. Office visit later this week if no better.

## 2016-12-28 NOTE — Telephone Encounter (Signed)
Pt was recently diagnosed by us with the flu and was scheduled to return to work today. Pt went to work and had to leave early due to not feeling well. Nurse is recommending that she stay out the rest of the week. Is it ok to give the pt an extension on her work excuse. Please advise.

## 2016-12-29 ENCOUNTER — Encounter: Payer: Self-pay | Admitting: Family Medicine

## 2016-12-29 NOTE — Telephone Encounter (Signed)
Work excuse completed, patient notified.

## 2016-12-30 ENCOUNTER — Telehealth: Payer: Self-pay | Admitting: Family Medicine

## 2016-12-30 MED ORDER — BENZONATATE 100 MG PO CAPS: 100.0000 mg | ORAL_CAPSULE | Freq: Three times a day (TID) | ORAL | 0 refills | Status: DC | PRN

## 2016-12-30 NOTE — Telephone Encounter (Signed)
Prescription sent electronically to pharmacy. Patient notified. 

## 2016-12-30 NOTE — Telephone Encounter (Signed)
Patient has a persistent cough and would like some cough medicine prescribed.

## 2016-12-30 NOTE — Telephone Encounter (Signed)
Pt is needing something called in for her cough if possible.    [facility] [place]

## 2016-12-30 NOTE — Telephone Encounter (Signed)
TESSS PERLES 100MG  ONE UP TO TID PRN COUGH 30

## 2017-01-05 DIAGNOSIS — Z029 Encounter for administrative examinations, unspecified: Secondary | ICD-10-CM

## 2017-03-20 ENCOUNTER — Other Ambulatory Visit: Payer: Self-pay | Admitting: Family Medicine

## 2017-03-22 ENCOUNTER — Ambulatory Visit (INDEPENDENT_AMBULATORY_CARE_PROVIDER_SITE_OTHER): Payer: BLUE CROSS/BLUE SHIELD | Admitting: Family Medicine

## 2017-03-22 ENCOUNTER — Encounter: Payer: Self-pay | Admitting: Family Medicine

## 2017-03-22 VITALS — BP 110/70 | Ht 60.0 in | Wt 156.1 lb

## 2017-03-22 DIAGNOSIS — E785 Hyperlipidemia, unspecified: Secondary | ICD-10-CM | POA: Diagnosis not present

## 2017-03-22 DIAGNOSIS — I10 Essential (primary) hypertension: Secondary | ICD-10-CM | POA: Diagnosis not present

## 2017-03-22 MED ORDER — SIMVASTATIN 20 MG PO TABS: 20.0000 mg | ORAL_TABLET | Freq: Every evening | ORAL | 1 refills | Status: DC

## 2017-03-22 MED ORDER — AMLODIPINE BESYLATE 10 MG PO TABS: 10.0000 mg | ORAL_TABLET | Freq: Every day | ORAL | 1 refills | Status: DC

## 2017-03-22 MED ORDER — TRIAMTERENE-HCTZ 37.5-25 MG PO CAPS: 1.0000 | ORAL_CAPSULE | Freq: Every morning | ORAL | 1 refills | Status: DC

## 2017-03-22 MED ORDER — LISINOPRIL 5 MG PO TABS: 5.0000 mg | ORAL_TABLET | Freq: Every day | ORAL | 1 refills | Status: DC

## 2017-03-22 NOTE — Progress Notes (Signed)
   Subjective:    Patient ID: Erin Foster, female    DOB: 04-28-1956, 61 y.o.   MRN: [id number]  Hypertension  This is a chronic problem. The current episode started more than 1 year ago.   Blood pressure medicine and blood pressure levels reviewed today with patient. Compliant with blood pressure medicine. States does not miss a dose. No obvious side effects. Blood pressure generally good when checked elsewhere. Watching salt intake.  Patient continues to take lipid medication regularly. No obvious side effects from it. Generally does not miss a dose. Prior blood work results are reviewed with patient. Patient continues to work on fat intake in diet  bp good when cke elsewher   Patient states no concerns this visit.  Review of Systems No headache, no major weight loss or weight gain, no chest pain no back pain abdominal pain no change in bowel habits complete ROS otherwise negative     Objective:   Physical Exam Alert vitals stable, NAD. Blood pressure good on repeat. HEENT normal. Lungs clear. Heart regular rate and rhythm.        Assessment & Plan:  Impression 1 hypertension good control discussed maintain same meds #2 hyperlipidemia status uncertain discuss to obtain blood work maintain same meds pending prior blood work reviewed diet exercise discussed recheck in 6 months

## 2017-03-23 LAB — BASIC METABOLIC PANEL
BUN / CREAT RATIO: 24 — AB (ref 9–23)
BUN: 17 mg/dL (ref 6–24)
CO2: 25 mmol/L (ref 18–29)
CREATININE: 0.7 mg/dL (ref 0.57–1.00)
Calcium: 9.8 mg/dL (ref 8.7–10.2)
Chloride: 100 mmol/L (ref 96–106)
GFR, EST AFRICAN AMERICAN: 110 mL/min/{1.73_m2} (ref >59)
GFR, EST NON AFRICAN AMERICAN: 95 mL/min/{1.73_m2} (ref >59)
Glucose: 82 mg/dL (ref 65–99)
Potassium: 4.4 mmol/L (ref 3.5–5.2)
SODIUM: 140 mmol/L (ref 134–144)

## 2017-03-23 LAB — LIPID PANEL
CHOL/HDL RATIO: 2.2 ratio (ref 0.0–4.4)
Cholesterol, Total: 231 mg/dL — ABNORMAL HIGH (ref 100–199)
HDL: 107 mg/dL (ref >39)
LDL CALC: 111 mg/dL — AB (ref 0–99)
Triglycerides: 67 mg/dL (ref 0–149)
VLDL Cholesterol Cal: 13 mg/dL (ref 5–40)

## 2017-03-23 LAB — HEPATIC FUNCTION PANEL
ALBUMIN: 4.2 g/dL (ref 3.5–5.5)
ALT: 26 IU/L (ref 0–32)
AST: 28 IU/L (ref 0–40)
Alkaline Phosphatase: 74 IU/L (ref 39–117)
Bilirubin Total: 0.5 mg/dL (ref 0.0–1.2)
Bilirubin, Direct: 0.15 mg/dL (ref 0.00–0.40)
TOTAL PROTEIN: 7.1 g/dL (ref 6.0–8.5)

## 2017-03-28 ENCOUNTER — Encounter: Payer: Self-pay | Admitting: Family Medicine

## 2017-04-11 DIAGNOSIS — F418 Other specified anxiety disorders: Secondary | ICD-10-CM | POA: Diagnosis not present

## 2017-04-11 DIAGNOSIS — Z0000 Encounter for general adult medical examination without abnormal findings: Secondary | ICD-10-CM | POA: Diagnosis not present

## 2017-04-11 DIAGNOSIS — N183 Chronic kidney disease, stage 3 (moderate): Secondary | ICD-10-CM | POA: Diagnosis not present

## 2017-04-11 DIAGNOSIS — E119 Type 2 diabetes mellitus without complications: Secondary | ICD-10-CM | POA: Diagnosis not present

## 2017-04-13 ENCOUNTER — Other Ambulatory Visit: Payer: Self-pay

## 2017-04-13 MED ORDER — SIMVASTATIN 20 MG PO TABS: 20.0000 mg | ORAL_TABLET | Freq: Every evening | ORAL | 1 refills | Status: DC

## 2017-05-10 DIAGNOSIS — E669 Obesity, unspecified: Secondary | ICD-10-CM | POA: Diagnosis not present

## 2017-05-10 DIAGNOSIS — E785 Hyperlipidemia, unspecified: Secondary | ICD-10-CM | POA: Diagnosis not present

## 2017-05-10 DIAGNOSIS — Z719 Counseling, unspecified: Secondary | ICD-10-CM | POA: Diagnosis not present

## 2017-05-10 DIAGNOSIS — Z6830 Body mass index (BMI) 30.0-30.9, adult: Secondary | ICD-10-CM | POA: Diagnosis not present

## 2017-05-10 DIAGNOSIS — I10 Essential (primary) hypertension: Secondary | ICD-10-CM | POA: Diagnosis not present

## 2017-05-10 DIAGNOSIS — Z008 Encounter for other general examination: Secondary | ICD-10-CM | POA: Diagnosis not present

## 2017-05-10 DIAGNOSIS — Z1389 Encounter for screening for other disorder: Secondary | ICD-10-CM | POA: Diagnosis not present

## 2017-06-15 ENCOUNTER — Ambulatory Visit (INDEPENDENT_AMBULATORY_CARE_PROVIDER_SITE_OTHER): Payer: BLUE CROSS/BLUE SHIELD | Admitting: Nurse Practitioner

## 2017-06-15 ENCOUNTER — Encounter: Payer: Self-pay | Admitting: Nurse Practitioner

## 2017-06-15 VITALS — BP 134/82 | Ht 60.0 in | Wt 160.0 lb

## 2017-06-15 DIAGNOSIS — Z1211 Encounter for screening for malignant neoplasm of colon: Secondary | ICD-10-CM | POA: Diagnosis not present

## 2017-06-15 DIAGNOSIS — Z01419 Encounter for gynecological examination (general) (routine) without abnormal findings: Secondary | ICD-10-CM | POA: Diagnosis not present

## 2017-06-15 NOTE — Progress Notes (Signed)
   Subjective:    Patient ID: Erin Foster, female    DOB: 10-Sep-1956, 61 y.o.   MRN: [id number]  HPI presents for her wellness exam. Same sexual partner. No vaginal bleeding or pelvic pain. Regular vision and dental exams. Has struggled with weight and activity lately. Taking care of her partner who recently had a stroke.     Review of Systems  Constitutional: Positive for activity change and fatigue. Negative for appetite change.  HENT: Negative for dental problem, ear pain, sinus pressure and sore throat.   Respiratory: Negative for cough, chest tightness, shortness of breath and wheezing.   Cardiovascular: Negative for chest pain.  Gastrointestinal: Negative for abdominal distention, abdominal pain, blood in stool, constipation, diarrhea, nausea and vomiting.  Genitourinary: Negative for difficulty urinating, dysuria, enuresis, frequency, genital sores, pelvic pain, urgency, vaginal bleeding and vaginal discharge.       Objective:   Physical Exam  Constitutional: She is oriented to person, place, and time. She appears well-developed. No distress.  HENT:  Right Ear: External ear normal.  Left Ear: External ear normal.  Mouth/Throat: Oropharynx is clear and moist.  Neck: Normal range of motion. Neck supple. No tracheal deviation present. No thyromegaly present.  Cardiovascular: Normal rate, regular rhythm and normal heart sounds.  Exam reveals no gallop.   No murmur heard. Pulmonary/Chest: Effort normal and breath sounds normal.  Abdominal: Soft. She exhibits no distension. There is no tenderness.  Genitourinary: Vagina normal and uterus normal. No vaginal discharge found.  Genitourinary Comments: External GU: no rashes or lesions. Vagina: slightly pale, no discharge. No CMT. Bimanual exam: no tenderness or obvious masses.   Musculoskeletal: She exhibits no edema.  Lymphadenopathy:    She has no cervical adenopathy.  Neurological: She is alert and oriented to person, place, and  time.  Skin: Skin is warm and dry. No rash noted.  Psychiatric: She has a normal mood and affect. Her behavior is normal.  Vitals reviewed. Breast exam: areas of dense tissue; no masses; axillae no adenopathy.  Last labs 03/22/17.       Assessment & Plan:  Well woman exam  Screen for colon cancer - Plan: Ambulatory referral to Gastroenterology  Encouraged restarting activity and healthy diet. Take daily vitamin D supplement. Return in about 3 months (around 09/15/2017) for follow up in October for 6 month recheck.

## 2017-06-21 ENCOUNTER — Other Ambulatory Visit: Payer: Self-pay | Admitting: Family Medicine

## 2017-06-23 ENCOUNTER — Encounter: Payer: Self-pay | Admitting: Family Medicine

## 2017-07-31 ENCOUNTER — Other Ambulatory Visit: Payer: Self-pay | Admitting: Family Medicine

## 2017-08-09 ENCOUNTER — Telehealth: Payer: Self-pay | Admitting: Family Medicine

## 2017-08-09 ENCOUNTER — Other Ambulatory Visit: Payer: Self-pay

## 2017-08-09 MED ORDER — AMLODIPINE BESYLATE 10 MG PO TABS: 10.0000 mg | ORAL_TABLET | Freq: Every day | ORAL | 1 refills | Status: DC

## 2017-08-09 NOTE — Telephone Encounter (Signed)
Left detailed message that rx was sent to the pharmacy.

## 2017-08-09 NOTE — Telephone Encounter (Signed)
Refilled

## 2017-08-09 NOTE — Telephone Encounter (Signed)
Pt is needing a refill on  amLODipine (NORVASC) 10 MG tablet   [facility] [place]

## 2017-09-13 ENCOUNTER — Ambulatory Visit: Payer: BLUE CROSS/BLUE SHIELD | Admitting: Family Medicine

## 2017-09-20 ENCOUNTER — Ambulatory Visit (INDEPENDENT_AMBULATORY_CARE_PROVIDER_SITE_OTHER): Payer: BLUE CROSS/BLUE SHIELD | Admitting: Family Medicine

## 2017-09-20 ENCOUNTER — Encounter: Payer: Self-pay | Admitting: Family Medicine

## 2017-09-20 ENCOUNTER — Other Ambulatory Visit: Payer: Self-pay | Admitting: *Deleted

## 2017-09-20 VITALS — BP 110/72 | Temp 98.2°F | Ht 60.0 in | Wt 160.0 lb

## 2017-09-20 DIAGNOSIS — I10 Essential (primary) hypertension: Secondary | ICD-10-CM | POA: Diagnosis not present

## 2017-09-20 DIAGNOSIS — E785 Hyperlipidemia, unspecified: Secondary | ICD-10-CM | POA: Diagnosis not present

## 2017-09-20 DIAGNOSIS — Z79899 Other long term (current) drug therapy: Secondary | ICD-10-CM | POA: Diagnosis not present

## 2017-09-20 DIAGNOSIS — M7061 Trochanteric bursitis, right hip: Secondary | ICD-10-CM

## 2017-09-20 DIAGNOSIS — Z23 Encounter for immunization: Secondary | ICD-10-CM

## 2017-09-20 MED ORDER — CETIRIZINE HCL 10 MG PO TABS: 10.0000 mg | ORAL_TABLET | Freq: Every day | ORAL | 3 refills | Status: DC

## 2017-09-20 MED ORDER — LISINOPRIL 5 MG PO TABS: 5.0000 mg | ORAL_TABLET | Freq: Every day | ORAL | 1 refills | Status: DC

## 2017-09-20 MED ORDER — MELOXICAM 15 MG PO TABS: 15.0000 mg | ORAL_TABLET | Freq: Every day | ORAL | 0 refills | Status: DC

## 2017-09-20 MED ORDER — AMLODIPINE BESYLATE 10 MG PO TABS: 10.0000 mg | ORAL_TABLET | Freq: Every day | ORAL | 1 refills | Status: DC

## 2017-09-20 MED ORDER — TRIAMTERENE-HCTZ 37.5-25 MG PO CAPS: 1.0000 | ORAL_CAPSULE | Freq: Every morning | ORAL | 1 refills | Status: DC

## 2017-09-20 MED ORDER — CETIRIZINE HCL 10 MG PO TABS: 10.0000 mg | ORAL_TABLET | Freq: Every day | ORAL | 1 refills | Status: DC

## 2017-09-20 MED ORDER — SIMVASTATIN 20 MG PO TABS: 20.0000 mg | ORAL_TABLET | Freq: Every evening | ORAL | 1 refills | Status: DC

## 2017-09-20 NOTE — Patient Instructions (Signed)
Hip bursitis get on web mcd and look up hip stretching n strengthening exercises

## 2017-09-20 NOTE — Progress Notes (Signed)
   Subjective:    Patient ID: Erin Foster, female    DOB: 08/25/1956, 61 y.o.   MRN: [id number]  HPIHTN. Eats healthy. Does not exercise. Takes meds every day.   Right leg pain. Pain hip down. Started a couple months ago.   Sinus symptoms. Nasal congestion. Started 2 days ago. Hit this week. Lots of presure and nose dranage. Some headache, frontal, pso . Pt was on zyrtec    Blood pressure medicine and blood pressure levels reviewed today with patient. Compliant with blood pressure medicine. States does not miss a dose. No obvious side effects. Blood pressure generally good when checked elsewhere. Watching salt intake.   Patient continues to take lipid medication regularly. No obvious side effects from it. Generally does not miss a dose. Prior blood work results are reviewed with patient. Patient continues to work on fat intake in diet  BPs generally god when ck ed elsdewhere  Overall good diet. Moving around m    Flu vaccine today.     Review of Systems No headache, no major weight loss or weight gain, no chest pain no back pain abdominal pain no change in bowel habits complete ROS otherwise negative     Objective:   Physical Exam  Alert and oriented, vitals reviewed and stable, NAD ENT-TM's and ext canals WNL bilat via otoscopic exam Soft palate, tonsils and post pharynx WNL via oropharyngeal exam Neck-symmetric, no masses; thyroid nonpalpable and nontender Pulmonary-no tachypnea or accessory muscle use; Clear without wheezes via auscultation Card--no abnrml murmurs, rhythm reg and rate WNL Carotid pulses symmetric, without bruits Hip positive lateral hip tenderness to deep palpation. Negative straight leg raise.      Assessment & Plan:  Impression 1 hypertension discussed maintain same meds diet exercise discussed compliance discussed  #2 hyperlipidemia.current status uncertain. Prior blood work reviewed.  #3 hip pain. Likely trochanteric bursitis discussed.  Symptom care discussed. Will prescr  Appropriate blood work and flu shot  Greater than 50% of this 25 minute face to face visit was spent in counseling and discussion and coordination of care regarding the above diagnosis/diagnosies

## 2017-09-21 LAB — HEPATIC FUNCTION PANEL
ALBUMIN: 4.7 g/dL (ref 3.6–4.8)
ALK PHOS: 71 IU/L (ref 39–117)
ALT: 21 IU/L (ref 0–32)
AST: 23 IU/L (ref 0–40)
BILIRUBIN TOTAL: 0.6 mg/dL (ref 0.0–1.2)
Bilirubin, Direct: 0.16 mg/dL (ref 0.00–0.40)
Total Protein: 7.4 g/dL (ref 6.0–8.5)

## 2017-09-21 LAB — LIPID PANEL
Chol/HDL Ratio: 2.2 ratio (ref 0.0–4.4)
Cholesterol, Total: 241 mg/dL — ABNORMAL HIGH (ref 100–199)
HDL: 109 mg/dL (ref >39)
LDL CALC: 122 mg/dL — AB (ref 0–99)
Triglycerides: 48 mg/dL (ref 0–149)
VLDL CHOLESTEROL CAL: 10 mg/dL (ref 5–40)

## 2017-09-25 ENCOUNTER — Encounter: Payer: Self-pay | Admitting: Family Medicine

## 2017-10-12 ENCOUNTER — Encounter: Payer: Self-pay | Admitting: Gastroenterology

## 2017-12-01 DIAGNOSIS — E669 Obesity, unspecified: Secondary | ICD-10-CM | POA: Diagnosis not present

## 2017-12-01 DIAGNOSIS — Z6830 Body mass index (BMI) 30.0-30.9, adult: Secondary | ICD-10-CM | POA: Diagnosis not present

## 2017-12-01 DIAGNOSIS — E785 Hyperlipidemia, unspecified: Secondary | ICD-10-CM | POA: Diagnosis not present

## 2017-12-01 DIAGNOSIS — I10 Essential (primary) hypertension: Secondary | ICD-10-CM | POA: Diagnosis not present

## 2017-12-01 DIAGNOSIS — Z1389 Encounter for screening for other disorder: Secondary | ICD-10-CM | POA: Diagnosis not present

## 2017-12-01 DIAGNOSIS — Z008 Encounter for other general examination: Secondary | ICD-10-CM | POA: Diagnosis not present

## 2017-12-01 DIAGNOSIS — Z719 Counseling, unspecified: Secondary | ICD-10-CM | POA: Diagnosis not present

## 2018-01-05 ENCOUNTER — Other Ambulatory Visit: Payer: Self-pay | Admitting: Family Medicine

## 2018-01-05 DIAGNOSIS — Z1231 Encounter for screening mammogram for malignant neoplasm of breast: Secondary | ICD-10-CM

## 2018-01-06 ENCOUNTER — Ambulatory Visit (HOSPITAL_COMMUNITY)
Admission: RE | Admit: 2018-01-06 | Discharge: 2018-01-06 | Disposition: A | Discharge status: Home / Self Care | Payer: BLUE CROSS/BLUE SHIELD | Source: Ambulatory Visit | Attending: Family Medicine | Admitting: Family Medicine

## 2018-01-06 DIAGNOSIS — Z1231 Encounter for screening mammogram for malignant neoplasm of breast: Secondary | ICD-10-CM | POA: Insufficient documentation

## 2018-03-21 ENCOUNTER — Ambulatory Visit: Payer: BLUE CROSS/BLUE SHIELD | Admitting: Family Medicine

## 2018-03-21 ENCOUNTER — Encounter: Payer: Self-pay | Admitting: Family Medicine

## 2018-03-21 VITALS — BP 128/80 | Ht 60.0 in | Wt 161.2 lb

## 2018-03-21 DIAGNOSIS — Z8673 Personal history of transient ischemic attack (TIA), and cerebral infarction without residual deficits: Secondary | ICD-10-CM

## 2018-03-21 DIAGNOSIS — Z79899 Other long term (current) drug therapy: Secondary | ICD-10-CM | POA: Diagnosis not present

## 2018-03-21 DIAGNOSIS — R29810 Facial weakness: Secondary | ICD-10-CM | POA: Diagnosis not present

## 2018-03-21 DIAGNOSIS — I69992 Facial weakness following unspecified cerebrovascular disease: Secondary | ICD-10-CM

## 2018-03-21 DIAGNOSIS — E785 Hyperlipidemia, unspecified: Secondary | ICD-10-CM

## 2018-03-21 DIAGNOSIS — I679 Cerebrovascular disease, unspecified: Secondary | ICD-10-CM

## 2018-03-21 DIAGNOSIS — I10 Essential (primary) hypertension: Secondary | ICD-10-CM | POA: Diagnosis not present

## 2018-03-21 MED ORDER — CETIRIZINE HCL 10 MG PO TABS: 10.0000 mg | ORAL_TABLET | Freq: Every day | ORAL | 1 refills | Status: DC

## 2018-03-21 MED ORDER — SIMVASTATIN 20 MG PO TABS: 20.0000 mg | ORAL_TABLET | Freq: Every evening | ORAL | 1 refills | Status: DC

## 2018-03-21 MED ORDER — LISINOPRIL 5 MG PO TABS: 5.0000 mg | ORAL_TABLET | Freq: Every day | ORAL | 1 refills | Status: DC

## 2018-03-21 MED ORDER — TRIAMTERENE-HCTZ 37.5-25 MG PO CAPS: 1.0000 | ORAL_CAPSULE | Freq: Every morning | ORAL | 1 refills | Status: DC

## 2018-03-21 MED ORDER — AMLODIPINE BESYLATE 10 MG PO TABS: 10.0000 mg | ORAL_TABLET | Freq: Every day | ORAL | 1 refills | Status: DC

## 2018-03-21 NOTE — Progress Notes (Signed)
   Subjective:    Patient ID: Erin Foster, female    DOB: 06-Jul-1956, 62 y.o.   MRN: [id number]  Hypertension  This is a chronic problem. The current episode started more than 1 year ago. Risk factors for coronary artery disease include post-menopausal state. Treatments tried: norvasc, lisinopril, triam/hctz, There are no compliance problems.     Blood pressure medicine and blood pressure levels reviewed today with patient. Compliant with blood pressure medicine. States does not miss a dose. No obvious side effects. Blood pressure generally good when checked elsewhere. Watching salt intake.  cck BPs usually 120 over 80, when cked elsewhere    has been walking at leat three d per wk   Patient continues to take lipid medication regularly. No obvious side effects from it. Generally does not miss a dose. Prior blood work results are reviewed with patient. Patient continues to work on fat intake in diet   Eating the right stuff generally  History of stroke.  Patient notes residual facial weakness.  Sometimes difficulty with speech when tired or emotional  No new neurological deficits Review of Systems No headache, no major weight loss or weight gain, no chest pain no back pain abdominal pain no change in bowel habits complete ROS otherwise negative     Objective:   Physical Exam Alert and oriented, vitals reviewed and stable, NAD ENT-TM's and ext canals WNL bilat via otoscopic exam Soft palate, tonsils and post pharynx WNL via oropharyngeal exam Neck-symmetric, no masses; thyroid nonpalpable and nontender Pulmonary-no tachypnea or accessory muscle use; Clear without wheezes via auscultation Card--no abnrml murmurs, rhythm reg and rate WNL Carotid pulses symmetric, without bruits On smiling  Mild left-sided weakness       Assessment & Plan:  Impression #1 history of stroke with residual facial weakness.  Occasionally symptomatic.  No new symptomatology  2.  Hypertension good  control discussed to maintain same meds compliance discussed  3.  Hyperlipidemia.  Status uncertain.  Appropriate blood work.  Maintain current meds based on prior results.

## 2018-03-25 DIAGNOSIS — I10 Essential (primary) hypertension: Secondary | ICD-10-CM | POA: Diagnosis not present

## 2018-03-25 DIAGNOSIS — E785 Hyperlipidemia, unspecified: Secondary | ICD-10-CM | POA: Diagnosis not present

## 2018-03-25 DIAGNOSIS — Z79899 Other long term (current) drug therapy: Secondary | ICD-10-CM | POA: Diagnosis not present

## 2018-03-26 LAB — LIPID PANEL
CHOL/HDL RATIO: 2.2 ratio (ref 0.0–4.4)
Cholesterol, Total: 223 mg/dL — ABNORMAL HIGH (ref 100–199)
HDL: 103 mg/dL (ref >39)
LDL Calculated: 111 mg/dL — ABNORMAL HIGH (ref 0–99)
Triglycerides: 45 mg/dL (ref 0–149)
VLDL CHOLESTEROL CAL: 9 mg/dL (ref 5–40)

## 2018-03-26 LAB — BASIC METABOLIC PANEL
BUN/Creatinine Ratio: 14 (ref 12–28)
BUN: 13 mg/dL (ref 8–27)
CHLORIDE: 103 mmol/L (ref 96–106)
CO2: 24 mmol/L (ref 20–29)
Calcium: 9.6 mg/dL (ref 8.7–10.3)
Creatinine, Ser: 0.93 mg/dL (ref 0.57–1.00)
GFR calc Af Amer: 77 mL/min/{1.73_m2} (ref >59)
GFR calc non Af Amer: 67 mL/min/{1.73_m2} (ref >59)
GLUCOSE: 85 mg/dL (ref 65–99)
POTASSIUM: 4.3 mmol/L (ref 3.5–5.2)
SODIUM: 140 mmol/L (ref 134–144)

## 2018-03-26 LAB — HEPATIC FUNCTION PANEL
ALK PHOS: 69 IU/L (ref 39–117)
ALT: 24 IU/L (ref 0–32)
AST: 28 IU/L (ref 0–40)
Albumin: 4.1 g/dL (ref 3.6–4.8)
Bilirubin Total: 0.5 mg/dL (ref 0.0–1.2)
Bilirubin, Direct: 0.13 mg/dL (ref 0.00–0.40)
TOTAL PROTEIN: 6.7 g/dL (ref 6.0–8.5)

## 2018-03-28 ENCOUNTER — Other Ambulatory Visit: Payer: Self-pay | Admitting: *Deleted

## 2018-03-28 MED ORDER — CETIRIZINE HCL 10 MG PO TABS: 10.0000 mg | ORAL_TABLET | Freq: Every day | ORAL | 1 refills | Status: DC

## 2018-04-09 ENCOUNTER — Encounter: Payer: Self-pay | Admitting: Family Medicine

## 2018-04-26 ENCOUNTER — Ambulatory Visit (INDEPENDENT_AMBULATORY_CARE_PROVIDER_SITE_OTHER): Payer: BLUE CROSS/BLUE SHIELD | Admitting: Nurse Practitioner

## 2018-04-26 ENCOUNTER — Encounter: Payer: Self-pay | Admitting: Nurse Practitioner

## 2018-04-26 VITALS — BP 128/70 | Temp 98.0°F | Ht 60.0 in | Wt 161.0 lb

## 2018-04-26 DIAGNOSIS — B9689 Other specified bacterial agents as the cause of diseases classified elsewhere: Secondary | ICD-10-CM

## 2018-04-26 DIAGNOSIS — J069 Acute upper respiratory infection, unspecified: Secondary | ICD-10-CM | POA: Diagnosis not present

## 2018-04-26 MED ORDER — AZITHROMYCIN 250 MG PO TABS: ORAL_TABLET | ORAL | 0 refills | Status: DC

## 2018-04-26 NOTE — Progress Notes (Signed)
Subjective: Presents for complaints of persistent cough over the past week.  No fever.  Sore throat with hoarseness.  No headache.  Cough worse at nighttime, occasionally producing green sputum.  No wheezing.  Bilateral ear pain more on the right.  Some popping at times.  Minimal relief with Claritin.  Objective:   BP 128/70   Temp 98 F (36.7 C) (Oral)   Ht 5' (1.524 m)   Wt 161 lb (73 kg)   BMI 31.44 kg/m  NAD.  Alert, oriented.  TMs clear effusion, no erythema.  Pharynx injected with PND noted.  Neck supple with mild soft anterior adenopathy.  Lungs clear.  Heart regular rate rhythm.  Assessment:  Bacterial upper respiratory infection    Plan:   Meds ordered this encounter  Medications  . azithromycin (ZITHROMAX Z-PAK) 250 MG tablet    Sig: Take 2 tablets (500 mg) on  Day 1,  followed by 1 tablet (250 mg) once daily on Days 2 through 5.    Dispense:  6 each    Refill:  0    Order Specific Question:   Supervising Provider    Answer:   Merlyn Albert [[id number]]   Mucinex DM as directed for cough and congestion.  Continue Claritin as directed.  Warning signs reviewed.  Call back in 5 to 7 days if no improvement, sooner if worse.

## 2018-04-27 ENCOUNTER — Encounter: Payer: Self-pay | Admitting: Nurse Practitioner

## 2018-06-15 DIAGNOSIS — Z719 Counseling, unspecified: Secondary | ICD-10-CM | POA: Diagnosis not present

## 2018-06-15 DIAGNOSIS — Z7189 Other specified counseling: Secondary | ICD-10-CM | POA: Diagnosis not present

## 2018-06-15 DIAGNOSIS — Z6830 Body mass index (BMI) 30.0-30.9, adult: Secondary | ICD-10-CM | POA: Diagnosis not present

## 2018-06-15 DIAGNOSIS — I10 Essential (primary) hypertension: Secondary | ICD-10-CM | POA: Diagnosis not present

## 2018-06-15 DIAGNOSIS — E785 Hyperlipidemia, unspecified: Secondary | ICD-10-CM | POA: Diagnosis not present

## 2018-06-15 DIAGNOSIS — E669 Obesity, unspecified: Secondary | ICD-10-CM | POA: Diagnosis not present

## 2018-06-15 DIAGNOSIS — Z008 Encounter for other general examination: Secondary | ICD-10-CM | POA: Diagnosis not present

## 2018-07-25 DIAGNOSIS — M79674 Pain in right toe(s): Secondary | ICD-10-CM | POA: Diagnosis not present

## 2018-07-25 DIAGNOSIS — L600 Ingrowing nail: Secondary | ICD-10-CM | POA: Diagnosis not present

## 2018-08-29 ENCOUNTER — Encounter: Payer: Self-pay | Admitting: Family Medicine

## 2018-08-29 ENCOUNTER — Ambulatory Visit: Payer: BLUE CROSS/BLUE SHIELD | Admitting: Family Medicine

## 2018-08-29 VITALS — BP 108/70 | Temp 99.7°F | Ht 60.0 in | Wt 159.0 lb

## 2018-08-29 DIAGNOSIS — I10 Essential (primary) hypertension: Secondary | ICD-10-CM | POA: Diagnosis not present

## 2018-08-29 DIAGNOSIS — E785 Hyperlipidemia, unspecified: Secondary | ICD-10-CM | POA: Diagnosis not present

## 2018-08-29 DIAGNOSIS — Z79899 Other long term (current) drug therapy: Secondary | ICD-10-CM | POA: Diagnosis not present

## 2018-08-29 MED ORDER — TRIAMTERENE-HCTZ 37.5-25 MG PO CAPS: 1.0000 | ORAL_CAPSULE | Freq: Every morning | ORAL | 1 refills | Status: DC

## 2018-08-29 MED ORDER — CETIRIZINE HCL 10 MG PO TABS: 10.0000 mg | ORAL_TABLET | Freq: Every day | ORAL | 1 refills | Status: DC

## 2018-08-29 MED ORDER — SIMVASTATIN 20 MG PO TABS: 20.0000 mg | ORAL_TABLET | Freq: Every evening | ORAL | 1 refills | Status: DC

## 2018-08-29 MED ORDER — AMLODIPINE BESYLATE 10 MG PO TABS: 10.0000 mg | ORAL_TABLET | Freq: Every day | ORAL | 1 refills | Status: DC

## 2018-08-29 MED ORDER — LISINOPRIL 5 MG PO TABS: 5.0000 mg | ORAL_TABLET | Freq: Every day | ORAL | 1 refills | Status: DC

## 2018-08-29 NOTE — Progress Notes (Signed)
   Subjective:    Patient ID: Erin Foster, female    DOB: 1956-02-05, 62 y.o.   MRN: [id number]  Hypertension  This is a chronic problem. Compliance problems: takes meds every day, tries to eat healthy and exercise.    Pt had a wisdom tooth pulled yesterday. Prescribed naproxen. Would like to know if its ok to take with the rest of her meds.   Blood pressure medicine and blood pressure levels reviewed today with patient. Compliant with blood pressure medicine. States does not miss a dose. No obvious side effects. Blood pressure generally good when checked elsewhere. Watching salt intake.   Patient continues to take lipid medication regularly. No obvious side effects from it. Generally does not miss a dose. Prior blood work results are reviewed with patient. Patient continues to work on fat intake in diet  Diet is a slow g   No new tia symts   Working full time  Has known history of stroke back to work time.no recdnt tia or new stroke symtoms, some resid weakness  Declines flu vaccine.   Review of Systems No headache, no major weight loss or weight gain, no chest pain no back pain abdominal pain no change in bowel habits complete ROS otherwise negative     Objective:   Physical Exam  Alert and oriented, vitals reviewed and stable, NAD ENT-TM's and ext canals WNL bilat via otoscopic exam Soft palate, tonsils and post pharynx WNL via oropharyngeal exam Neck-symmetric, no masses; thyroid nonpalpable and nontender Pulmonary-no tachypnea or accessory muscle use; Clear without wheezes via auscultation Card--no abnrml murmurs, rhythm reg and rate WNL Carotid pulses symmetric, without bruits .      Assessment &.d Plan:  Impression hypertension.  Good control.  Discussed.  Compliance discussed.  Exercise encouraged.  2.  Hyperlipidemia.  Prior blood work reviewed.  Compliance discussed appropriate blood work ordered  3.  Status post stroke.  No recent symptomatology.   Compliant with aspirin.  Exercise discussed  Greater than 50% of this 25 minute face to face visit was spent in counseling and discussion and coordination of care regarding the above diagnosis/diagnosies   Follow-up as scheduled

## 2018-08-30 LAB — HEPATIC FUNCTION PANEL
ALT: 26 IU/L (ref 0–32)
AST: 27 IU/L (ref 0–40)
Albumin: 4.3 g/dL (ref 3.6–4.8)
Alkaline Phosphatase: 71 IU/L (ref 39–117)
BILIRUBIN TOTAL: 0.8 mg/dL (ref 0.0–1.2)
Bilirubin, Direct: 0.2 mg/dL (ref 0.00–0.40)
Total Protein: 7 g/dL (ref 6.0–8.5)

## 2018-08-30 LAB — LIPID PANEL
Chol/HDL Ratio: 2 ratio (ref 0.0–4.4)
Cholesterol, Total: 246 mg/dL — ABNORMAL HIGH (ref 100–199)
HDL: 124 mg/dL (ref >39)
LDL CALC: 110 mg/dL — AB (ref 0–99)
Triglycerides: 59 mg/dL (ref 0–149)
VLDL Cholesterol Cal: 12 mg/dL (ref 5–40)

## 2018-09-03 ENCOUNTER — Encounter: Payer: Self-pay | Admitting: Family Medicine

## 2018-10-17 ENCOUNTER — Ambulatory Visit (INDEPENDENT_AMBULATORY_CARE_PROVIDER_SITE_OTHER): Payer: BLUE CROSS/BLUE SHIELD | Admitting: Family Medicine

## 2018-10-17 ENCOUNTER — Encounter: Payer: Self-pay | Admitting: Family Medicine

## 2018-10-17 VITALS — BP 100/68 | Ht 60.0 in | Wt 161.0 lb

## 2018-10-17 DIAGNOSIS — Z124 Encounter for screening for malignant neoplasm of cervix: Secondary | ICD-10-CM

## 2018-10-17 DIAGNOSIS — Z0000 Encounter for general adult medical examination without abnormal findings: Secondary | ICD-10-CM

## 2018-10-17 DIAGNOSIS — Z23 Encounter for immunization: Secondary | ICD-10-CM

## 2018-10-17 MED ORDER — AMLODIPINE BESYLATE 5 MG PO TABS: 5.0000 mg | ORAL_TABLET | Freq: Every day | ORAL | 1 refills | Status: DC

## 2018-10-17 NOTE — Patient Instructions (Signed)
Decrease your amlodipine (norvasc) to 5 mg daily.

## 2018-10-17 NOTE — Progress Notes (Signed)
Subjective:    Patient ID: Erin Foster, female    DOB: December 05, 1955, 62 y.o.   MRN: [id number]  HPI The patient comes in today for a wellness visit.  A review of their health history was completed. A review of medications was also completed.  Any needed refills; No  Eating habits: Good  Falls/  MVA accidents in past few months: None  Regular exercise: Walking occasionally, working on increasing  Specialist pt sees on regular basis: No  Preventative health issues were discussed. Colonoscopy overdue, pt will schedule the first of the year. Pap smear will be done today, no hx of abnormals.   Sexually active, 1 partner, Declines STD screening, denies problems.  Additional concerns:None  Past Medical History:  Diagnosis Date  . Asthma   . CVA (cerebral vascular accident) (HCC)   . Hyperlipidemia   . Hypertension    No Known Allergies   Past Surgical History:  Procedure Laterality Date  . COLONOSCOPY     Current Outpatient Medications:  .  amLODipine (NORVASC) 5 MG tablet, Take 1 tablet (5 mg total) by mouth daily., Disp: 90 tablet, Rfl: 1 .  aspirin 81 MG tablet, Take 81 mg by mouth daily., Disp: , Rfl:  .  lisinopril (PRINIVIL,ZESTRIL) 5 MG tablet, Take 1 tablet (5 mg total) by mouth daily., Disp: 90 tablet, Rfl: 1 .  simvastatin (ZOCOR) 20 MG tablet, Take 1 tablet (20 mg total) by mouth every evening., Disp: 90 tablet, Rfl: 1 .  triamterene-hydrochlorothiazide (DYAZIDE) 37.5-25 MG capsule, Take 1 each (1 capsule total) by mouth every morning., Disp: 90 capsule, Rfl: 1 .  cetirizine (ZYRTEC ALLERGY) 10 MG tablet, Take 1 tablet (10 mg total) by mouth daily. (Patient not taking: Reported on 10/17/2018), Disp: 90 tablet, Rfl: 1  Review of Systems  Constitutional: Negative for chills, fatigue, fever and unexpected weight change.  HENT: Negative for congestion, ear pain, sinus pressure, sinus pain and sore throat.   Eyes: Negative for discharge and visual disturbance.    Respiratory: Negative for cough, shortness of breath and wheezing.   Cardiovascular: Negative for chest pain and leg swelling.  Gastrointestinal: Negative for abdominal pain, blood in stool, constipation, diarrhea, nausea and vomiting.  Genitourinary: Negative for difficulty urinating, hematuria, pelvic pain, vaginal bleeding and vaginal discharge.  Neurological: Negative for headaches.  Psychiatric/Behavioral: Negative for suicidal ideas.  All other systems reviewed and are negative.      Objective:   Physical Exam  Constitutional: She is oriented to person, place, and time. She appears well-developed and well-nourished. No distress.  HENT:  Head: Normocephalic and atraumatic.  Mouth/Throat: Oropharynx is clear and moist.  Eyes: Pupils are equal, round, and reactive to light. Conjunctivae and EOM are normal. Right eye exhibits no discharge. Left eye exhibits no discharge.  Neck: Neck supple. No thyromegaly present.  Cardiovascular: Normal rate, regular rhythm and normal heart sounds.  No murmur heard. Pulmonary/Chest: Effort normal and breath sounds normal. No respiratory distress. She has no wheezes. Right breast exhibits no inverted nipple, no mass, no nipple discharge, no skin change and no tenderness. Left breast exhibits no inverted nipple, no mass, no nipple discharge, no skin change and no tenderness.  Abdominal: Soft. Bowel sounds are normal. She exhibits no distension and no mass. There is no tenderness.  Genitourinary: Vagina normal and uterus normal. There is no rash, tenderness or lesion on the right labia. There is no rash, tenderness or lesion on the left labia. Cervix exhibits no motion tenderness. Right  adnexum displays no mass and no tenderness. Left adnexum displays no mass and no tenderness.  Genitourinary Comments: Chaperone present.  Musculoskeletal: She exhibits no edema or deformity.  Lymphadenopathy:    She has no cervical adenopathy.  Neurological: She is alert  and oriented to person, place, and time. Coordination normal.  Skin: Skin is warm and dry.  Psychiatric: She has a normal mood and affect.  Nursing note and vitals reviewed.     Assessment & Plan:  1. Annual physical exam Adult wellness-complete.wellness physical was conducted today. Importance of diet and exercise were discussed in detail.  In addition to this a discussion regarding safety was also covered. We also reviewed over immunizations and gave recommendations regarding current immunization needed for age.   -Flu vaccine today In addition to this additional areas were also touched on including: Preventative health exams needed:  Colonoscopy: pt is overdue, she will schedule beginning of the year Mammogram: UTD, due yearly Pap Smear: done today, will notify of results  Patient was advised yearly wellness exam.  BP low today, discussed with Dr. Brett Canales, will decrease amlodipine to 5 mg daily. Pt will f/u with nurse visit in about 1 month for recheck on her BP. She will notify us if she has any lows or is symptomatic.  2. Screening for cervical cancer - Plan: Pap IG and HPV (high risk) DNA detection  3. Need for influenza vaccination - Plan: Flu Vaccine QUAD 36+ mos IM  Dr. Lubertha South was consulted on this case and is in agreement with the above treatment plan.

## 2018-10-24 ENCOUNTER — Encounter: Payer: Self-pay | Admitting: Family Medicine

## 2018-10-24 LAB — PAP IG AND HPV HIGH-RISK

## 2018-10-24 LAB — HPV, LOW VOLUME (REFLEX): HPV, LOW VOL REFLEX: NEGATIVE

## 2018-11-14 ENCOUNTER — Ambulatory Visit (INDEPENDENT_AMBULATORY_CARE_PROVIDER_SITE_OTHER): Payer: BLUE CROSS/BLUE SHIELD | Admitting: Family Medicine

## 2018-11-14 VITALS — BP 108/70

## 2018-11-14 DIAGNOSIS — I959 Hypotension, unspecified: Secondary | ICD-10-CM

## 2018-11-14 NOTE — Progress Notes (Signed)
Pt came in for nurse visit. Pt BP was 108/70. At last visit patient was advised to decrease amlodipine to 5 mg. Pt states she was still the 10mg . Spoke with NP who concurred with Dr.Scott; pt is to take 5 mg amlodipine and [facility] [place]. Pt is to check up in 3-4 weeks with Dr. Brett CanalesSteve.

## 2018-12-05 ENCOUNTER — Encounter: Payer: Self-pay | Admitting: Family Medicine

## 2018-12-05 ENCOUNTER — Ambulatory Visit (INDEPENDENT_AMBULATORY_CARE_PROVIDER_SITE_OTHER): Payer: BLUE CROSS/BLUE SHIELD | Admitting: Family Medicine

## 2018-12-05 VITALS — BP 136/88 | Wt 161.0 lb

## 2018-12-05 DIAGNOSIS — I10 Essential (primary) hypertension: Secondary | ICD-10-CM | POA: Diagnosis not present

## 2018-12-05 MED ORDER — AMLODIPINE BESYLATE 5 MG PO TABS: 5.0000 mg | ORAL_TABLET | Freq: Every day | ORAL | 1 refills | Status: DC

## 2018-12-05 MED ORDER — TRIAMTERENE-HCTZ 37.5-25 MG PO CAPS: 1.0000 | ORAL_CAPSULE | Freq: Every morning | ORAL | 1 refills | Status: DC

## 2018-12-05 MED ORDER — LISINOPRIL 5 MG PO TABS: 5.0000 mg | ORAL_TABLET | Freq: Every day | ORAL | 1 refills | Status: DC

## 2018-12-05 MED ORDER — SIMVASTATIN 20 MG PO TABS: 20.0000 mg | ORAL_TABLET | Freq: Every evening | ORAL | 1 refills | Status: DC

## 2018-12-05 NOTE — Progress Notes (Signed)
   Subjective:    Patient ID: Erin Foster, female    DOB: May 15, 1956, 63 y.o.   MRN: [id number]  HPI Pt here today for BP recheck. Pt came in for nurse visit to recheck BP. Pt was informed to take 5 mg amlodipine instead of 10. Pt states she is taking 5 mg. Pt states she has the nurse at work to check BP and then her sister checks it. Pt states every once in a while she will feel off balance Now on lower dose of norvase  At five mg  Handling well  Certainly less dizziness   Review of Systems No headache, no major weight loss or weight gain, no chest pain no back pain abdominal pain no change in bowel habits complete ROS otherwise negative     Objective:   Physical Exam  Alert vitals stable, NAD. Blood pressure good on repeat. HEENT normal. Lungs clear. Heart regular rate and rhythm.      Assessment & Plan:  Impression hypertension.  Improved control discussed maintain same dose.  Follow-up in 6 months at this point.  Warning signs discussed

## 2018-12-19 ENCOUNTER — Other Ambulatory Visit (HOSPITAL_COMMUNITY): Payer: Self-pay | Admitting: Family Medicine

## 2018-12-19 DIAGNOSIS — Z1231 Encounter for screening mammogram for malignant neoplasm of breast: Secondary | ICD-10-CM

## 2018-12-28 DIAGNOSIS — E669 Obesity, unspecified: Secondary | ICD-10-CM | POA: Diagnosis not present

## 2018-12-28 DIAGNOSIS — Z008 Encounter for other general examination: Secondary | ICD-10-CM | POA: Diagnosis not present

## 2018-12-28 DIAGNOSIS — I10 Essential (primary) hypertension: Secondary | ICD-10-CM | POA: Diagnosis not present

## 2018-12-28 DIAGNOSIS — E785 Hyperlipidemia, unspecified: Secondary | ICD-10-CM | POA: Diagnosis not present

## 2019-01-10 ENCOUNTER — Ambulatory Visit (HOSPITAL_COMMUNITY)
Admission: RE | Admit: 2019-01-10 | Discharge: 2019-01-10 | Disposition: A | Discharge status: Home / Self Care | Payer: BLUE CROSS/BLUE SHIELD | Source: Ambulatory Visit | Attending: Family Medicine | Admitting: Family Medicine

## 2019-01-10 DIAGNOSIS — Z1231 Encounter for screening mammogram for malignant neoplasm of breast: Secondary | ICD-10-CM | POA: Diagnosis not present

## 2019-03-06 ENCOUNTER — Ambulatory Visit: Payer: BLUE CROSS/BLUE SHIELD | Admitting: Family Medicine

## 2019-05-10 ENCOUNTER — Other Ambulatory Visit: Payer: Self-pay | Admitting: Family Medicine

## 2019-05-28 ENCOUNTER — Other Ambulatory Visit: Payer: Self-pay | Admitting: Family Medicine

## 2019-05-29 NOTE — Telephone Encounter (Signed)
Pt has virtual visit scheduled 7/15

## 2019-05-29 NOTE — Telephone Encounter (Signed)
Call and set up appt, then may ref times one

## 2019-05-29 NOTE — Telephone Encounter (Signed)
Please call pt and set up appt. Thank you.

## 2019-06-12 ENCOUNTER — Other Ambulatory Visit: Payer: Self-pay

## 2019-06-13 ENCOUNTER — Ambulatory Visit (INDEPENDENT_AMBULATORY_CARE_PROVIDER_SITE_OTHER): Payer: BC Managed Care – PPO | Admitting: Family Medicine

## 2019-06-13 ENCOUNTER — Encounter: Payer: Self-pay | Admitting: Family Medicine

## 2019-06-13 DIAGNOSIS — E785 Hyperlipidemia, unspecified: Secondary | ICD-10-CM | POA: Diagnosis not present

## 2019-06-13 DIAGNOSIS — I10 Essential (primary) hypertension: Secondary | ICD-10-CM | POA: Diagnosis not present

## 2019-06-13 MED ORDER — AMLODIPINE BESYLATE 5 MG PO TABS: 5.0000 mg | ORAL_TABLET | Freq: Every day | ORAL | 1 refills | Status: DC

## 2019-06-13 MED ORDER — SIMVASTATIN 20 MG PO TABS: 20.0000 mg | ORAL_TABLET | Freq: Every evening | ORAL | 1 refills | Status: DC

## 2019-06-13 MED ORDER — TRIAMTERENE-HCTZ 37.5-25 MG PO CAPS: 1.0000 | ORAL_CAPSULE | Freq: Every morning | ORAL | 1 refills | Status: DC

## 2019-06-13 MED ORDER — CETIRIZINE HCL 10 MG PO TABS: 10.0000 mg | ORAL_TABLET | Freq: Every day | ORAL | 1 refills | Status: DC

## 2019-06-13 MED ORDER — LISINOPRIL 5 MG PO TABS: 5.0000 mg | ORAL_TABLET | Freq: Every day | ORAL | 1 refills | Status: DC

## 2019-06-13 NOTE — Progress Notes (Signed)
   Subjective:    Patient ID: Erin Foster, female    DOB: 19-Aug-1956, 63 y.o.   MRN: [id number] Audio only Hypertension This is a chronic problem. There are no compliance problems.   pt states she gets the nurse to check her BP about 2 times a month. No other concerns or questions.  Virtual Visit via Video Note  I connected with Ardelle Balls on 06/13/19 at  9:30 AM EDT by a video enabled telemedicine application and verified that I am speaking with the correct person using two identifiers.  Location: Patient: home Provider: office   I discussed the limitations of evaluation and management by telemedicine and the availability of in person appointments. The patient expressed understanding and agreed to proceed.  History of Present Illness:    Observations/Objective:   Assessment and Plan:   Follow Up Instructions:    I discussed the assessment and treatment plan with the patient. The patient was provided an opportunity to ask questions and all were answered. The patient agreed with the plan and demonstrated an understanding of the instructions.   The patient was advised to call back or seek an in-person evaluation if the symptoms worsen or if the condition fails to improve as anticipated.  I provided13minutes of non-face-to-face time during this encounter.   Vicente Males, LPN  Blood pressure medicine and blood pressure levels reviewed today with patient. Compliant with blood pressure medicine. States does not miss a dose. No obvious side effects. Blood pressure generally good when checked elsewhere. Watching salt intake.   Patient continues to take lipid medication regularly. No obvious side effects from it. Generally does not miss a dose. Prior blood work results are reviewed with patient. Patient continues to work on fat intake in diet     Review of Systems No headache, no major weight loss or weight gain, no chest pain no back pain abdominal pain no change in  bowel habits complete ROS otherwise negative     Objective:   Physical Exam   Virtual     Assessment & Plan:  Impression hypertension.  Apparent good control discussed maintain same meds  Hyperlipidemia diet discussed prior blood work results discussed patient encouraged to maintain same dose follow-up in 6 months

## 2019-06-19 ENCOUNTER — Other Ambulatory Visit: Payer: Self-pay | Admitting: Family Medicine

## 2019-07-06 ENCOUNTER — Other Ambulatory Visit: Payer: Self-pay | Admitting: Family Medicine

## 2019-07-19 DIAGNOSIS — E669 Obesity, unspecified: Secondary | ICD-10-CM | POA: Diagnosis not present

## 2019-07-19 DIAGNOSIS — E785 Hyperlipidemia, unspecified: Secondary | ICD-10-CM | POA: Diagnosis not present

## 2019-07-19 DIAGNOSIS — Z719 Counseling, unspecified: Secondary | ICD-10-CM | POA: Diagnosis not present

## 2019-07-19 DIAGNOSIS — Z008 Encounter for other general examination: Secondary | ICD-10-CM | POA: Diagnosis not present

## 2019-08-14 DIAGNOSIS — E785 Hyperlipidemia, unspecified: Secondary | ICD-10-CM | POA: Diagnosis not present

## 2019-08-14 DIAGNOSIS — Z79899 Other long term (current) drug therapy: Secondary | ICD-10-CM | POA: Diagnosis not present

## 2019-08-14 DIAGNOSIS — Z0130 Encounter for examination of blood pressure without abnormal findings: Secondary | ICD-10-CM | POA: Diagnosis not present

## 2019-08-14 DIAGNOSIS — Z139 Encounter for screening, unspecified: Secondary | ICD-10-CM | POA: Diagnosis not present

## 2019-08-21 DIAGNOSIS — E785 Hyperlipidemia, unspecified: Secondary | ICD-10-CM | POA: Diagnosis not present

## 2019-08-21 DIAGNOSIS — E559 Vitamin D deficiency, unspecified: Secondary | ICD-10-CM | POA: Diagnosis not present

## 2019-08-21 DIAGNOSIS — R7309 Other abnormal glucose: Secondary | ICD-10-CM | POA: Diagnosis not present

## 2019-10-05 ENCOUNTER — Telehealth: Payer: Self-pay | Admitting: Family Medicine

## 2019-10-05 MED ORDER — LISINOPRIL 5 MG PO TABS: 5.0000 mg | ORAL_TABLET | Freq: Every day | ORAL | 1 refills | Status: DC

## 2019-10-05 MED ORDER — AMLODIPINE BESYLATE 5 MG PO TABS: 5.0000 mg | ORAL_TABLET | Freq: Every day | ORAL | 1 refills | Status: DC

## 2019-10-05 NOTE — Telephone Encounter (Signed)
patient is requesting refill on lisinopril 5mg  and amlodipine 5mg  called into [facility] [place]

## 2019-10-05 NOTE — Telephone Encounter (Signed)
Left message to return call 

## 2019-10-08 NOTE — Telephone Encounter (Signed)
Pt notified refills were sent friday

## 2019-10-11 DIAGNOSIS — Z6832 Body mass index (BMI) 32.0-32.9, adult: Secondary | ICD-10-CM | POA: Diagnosis not present

## 2019-10-11 DIAGNOSIS — E669 Obesity, unspecified: Secondary | ICD-10-CM | POA: Diagnosis not present

## 2019-10-11 DIAGNOSIS — Z7689 Persons encountering health services in other specified circumstances: Secondary | ICD-10-CM | POA: Diagnosis not present

## 2019-10-21 ENCOUNTER — Other Ambulatory Visit: Payer: Self-pay | Admitting: Family Medicine

## 2019-10-24 ENCOUNTER — Other Ambulatory Visit: Payer: Self-pay | Admitting: Family Medicine

## 2019-10-26 NOTE — Telephone Encounter (Signed)
Three mo 

## 2019-12-03 ENCOUNTER — Other Ambulatory Visit (HOSPITAL_COMMUNITY): Payer: Self-pay | Admitting: Family Medicine

## 2019-12-03 ENCOUNTER — Other Ambulatory Visit: Payer: Self-pay | Admitting: Family Medicine

## 2019-12-03 DIAGNOSIS — Z1231 Encounter for screening mammogram for malignant neoplasm of breast: Secondary | ICD-10-CM

## 2019-12-04 NOTE — Telephone Encounter (Signed)
lvm to schedule virtual   

## 2019-12-04 NOTE — Telephone Encounter (Signed)
Please schedule visit and then route back so we can send in refill

## 2019-12-12 IMAGING — MG DIGITAL SCREENING BILATERAL MAMMOGRAM WITH TOMO AND CAD
6 of 10 series · 6 of 30 positions shown · non-contrast
Comparison: Previous exam(s).

CLINICAL DATA: Screening.

EXAM:
DIGITAL SCREENING BILATERAL MAMMOGRAM WITH TOMO AND CAD

[R MLO synth-2D (1 of 2)]
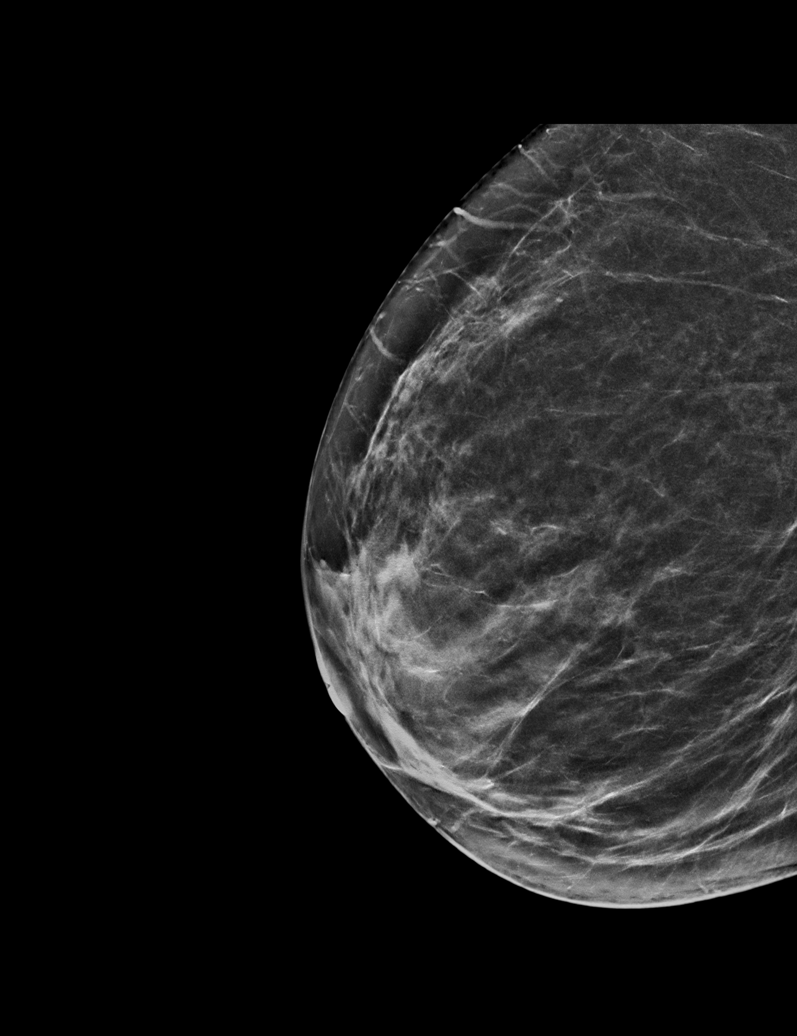

[L MLO synth-2D]
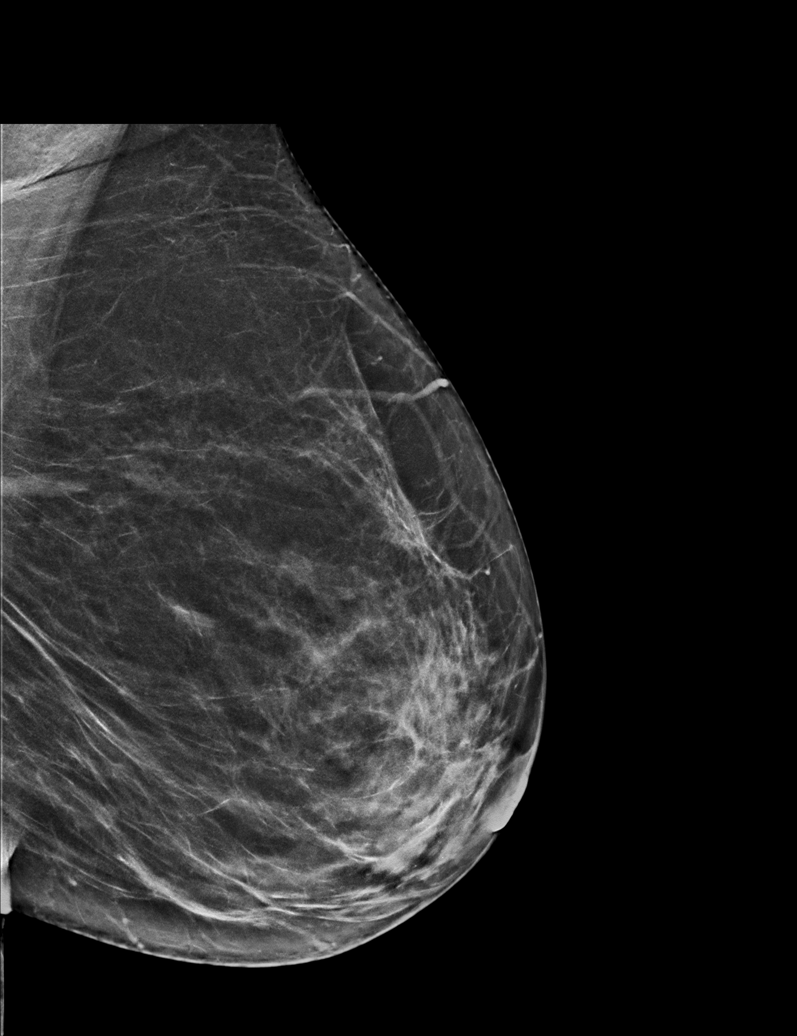

[L CC synth-2D]
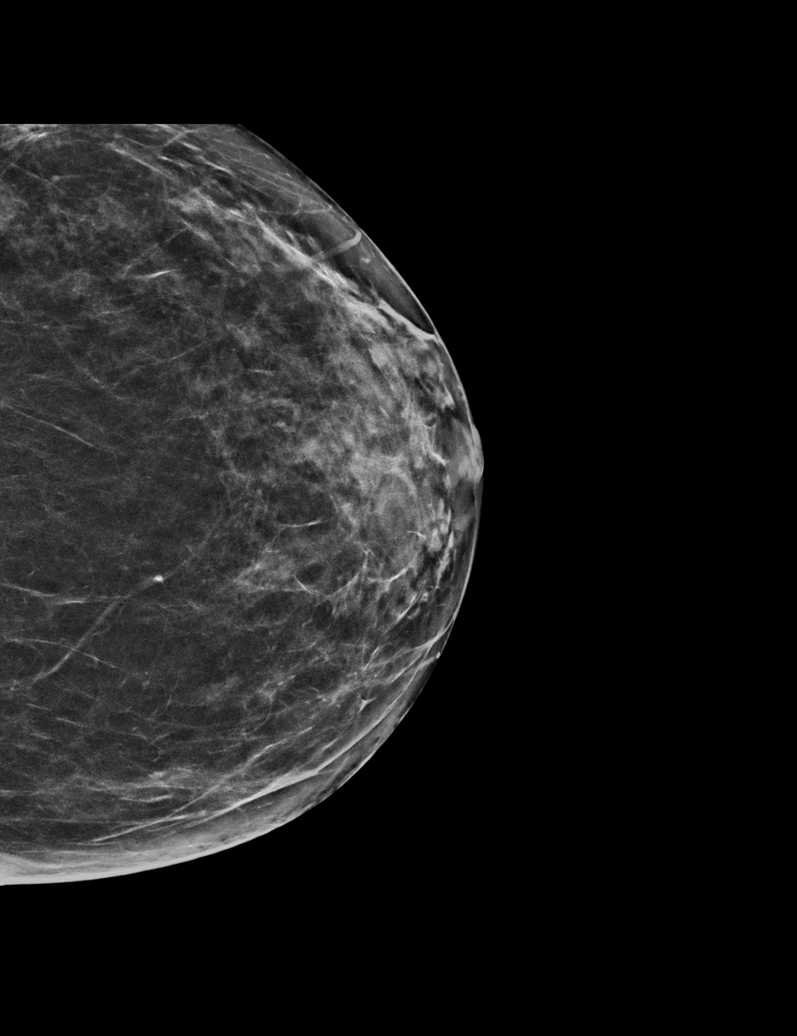

[R CC synth-2D]
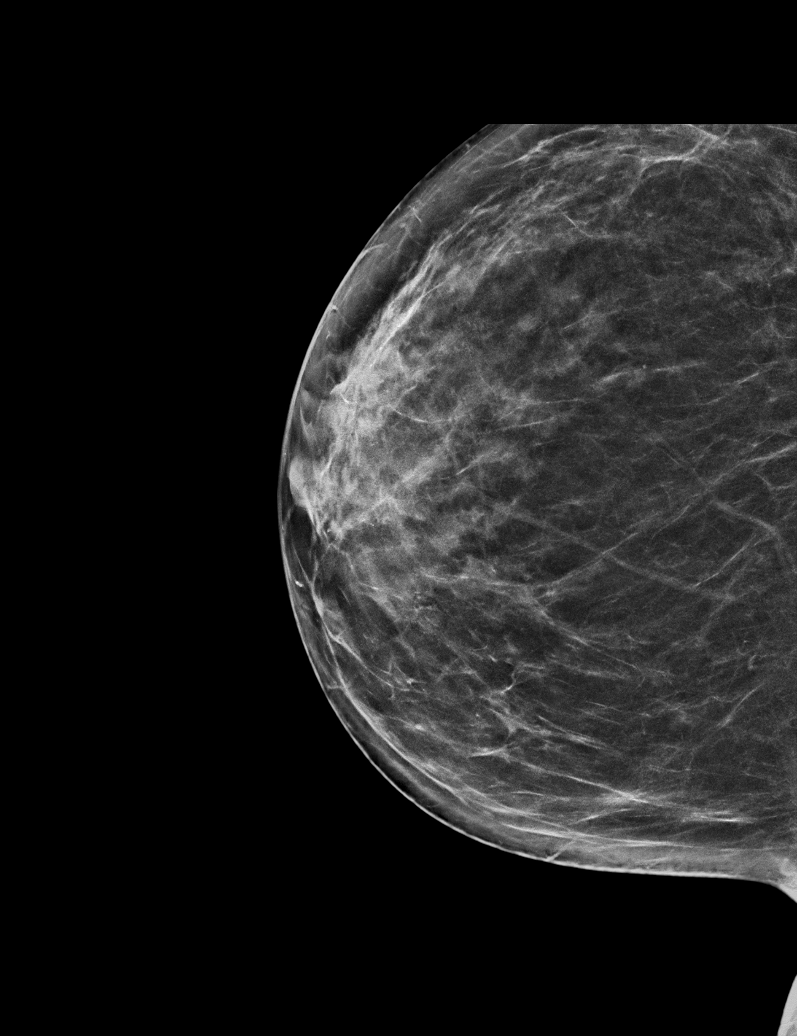

[R MLO synth-2D (2 of 2)]
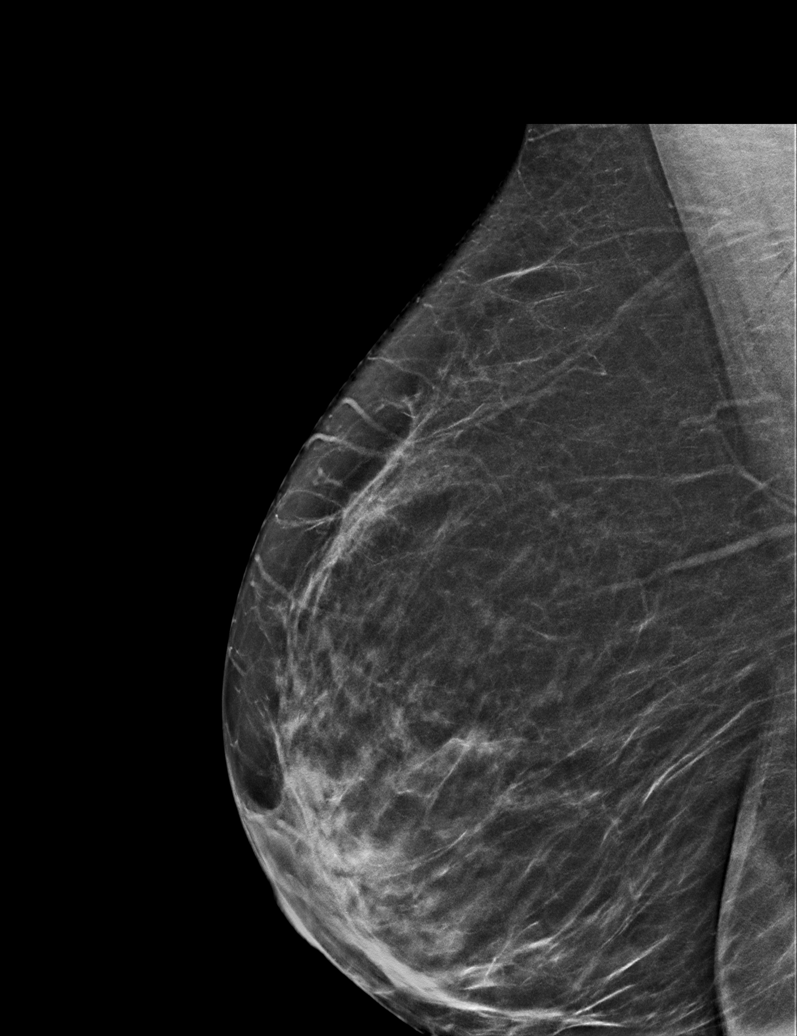

[R MLO tomo · tomo slice 33/64.0]
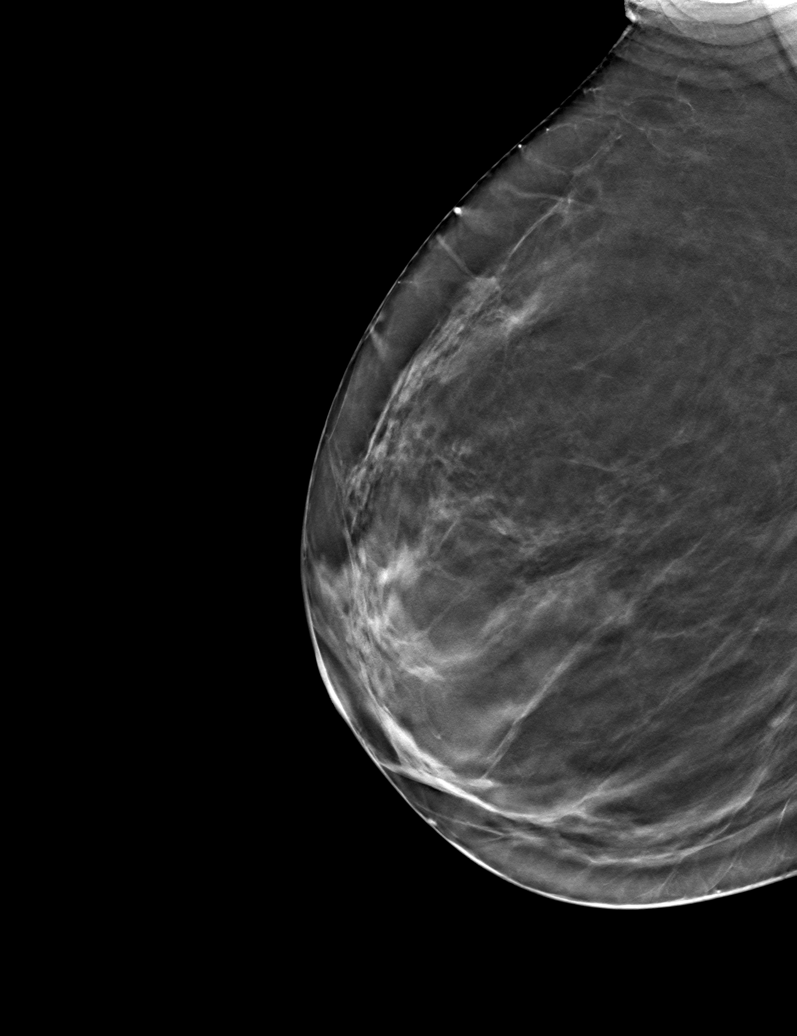

[6 of 30 positions shown; findings below may reference images not displayed]

ACR Breast Density Category b: There are scattered areas of
fibroglandular density.
FINDINGS: There are no findings suspicious for malignancy. Images were
processed with CAD.
IMPRESSION: No mammographic evidence of malignancy. A result letter of this
screening mammogram will be mailed directly to the patient.

RECOMMENDATION:
Screening mammogram in one year. (Code:[id number]-775)

BI-RADS CATEGORY  1: Negative.

## 2019-12-13 NOTE — Telephone Encounter (Signed)
lvm to schedule virtual visit  

## 2019-12-15 ENCOUNTER — Other Ambulatory Visit: Payer: Self-pay | Admitting: Family Medicine

## 2019-12-17 NOTE — Telephone Encounter (Signed)
Please call and schedule visit and then route back to nurses to send in one refill

## 2019-12-18 NOTE — Telephone Encounter (Signed)
lvm to schedule virtual visit  

## 2019-12-18 NOTE — Telephone Encounter (Signed)
Scheduled 1/25

## 2019-12-19 ENCOUNTER — Other Ambulatory Visit: Payer: Self-pay | Admitting: Family Medicine

## 2019-12-20 NOTE — Telephone Encounter (Signed)
Ok times one 

## 2019-12-24 ENCOUNTER — Other Ambulatory Visit: Payer: Self-pay

## 2019-12-24 ENCOUNTER — Encounter: Payer: Self-pay | Admitting: Family Medicine

## 2019-12-24 ENCOUNTER — Ambulatory Visit (INDEPENDENT_AMBULATORY_CARE_PROVIDER_SITE_OTHER): Payer: BC Managed Care – PPO | Admitting: Family Medicine

## 2019-12-24 DIAGNOSIS — I10 Essential (primary) hypertension: Secondary | ICD-10-CM

## 2019-12-24 DIAGNOSIS — E785 Hyperlipidemia, unspecified: Secondary | ICD-10-CM

## 2019-12-24 MED ORDER — TRIAMTERENE-HCTZ 37.5-25 MG PO CAPS: ORAL_CAPSULE | ORAL | 1 refills | Status: DC

## 2019-12-24 MED ORDER — AMLODIPINE BESYLATE 5 MG PO TABS: 5.0000 mg | ORAL_TABLET | Freq: Every day | ORAL | 1 refills | Status: DC

## 2019-12-24 MED ORDER — LISINOPRIL 5 MG PO TABS: 5.0000 mg | ORAL_TABLET | Freq: Every day | ORAL | 1 refills | Status: DC

## 2019-12-24 MED ORDER — SIMVASTATIN 20 MG PO TABS: 20.0000 mg | ORAL_TABLET | Freq: Every evening | ORAL | 1 refills | Status: DC

## 2019-12-24 NOTE — Addendum Note (Signed)
Addended by: Metro Kung on: 12/24/2019 01:16 PM   Modules accepted: Orders

## 2019-12-24 NOTE — Progress Notes (Signed)
   Subjective:  Audio only  Patient ID: Erin Foster, female    DOB: 11-17-1956, 63 y.o.   MRN: [id number] Pt states she has not checked up bp today but does check from time to time at work and it has been good.  Hypertension This is a chronic problem. Treatments tried: norvasc, lisinopril, dyazide. Compliance problems: takes meds every day, tries to eat well     Pt states no concerns today.   Virtual Visit via Telephone Note  I connected with Erin Foster on 12/24/19 at 11:00 AM EST by telephone and verified that I am speaking with the correct person using two identifiers.  Location: Patient: home Provider: office   I discussed the limitations, risks, security and privacy concerns of performing an evaluation and management service by telephone and the availability of in person appointments. I also discussed with the patient that there may be a patient responsible charge related to this service. The patient expressed understanding and agreed to proceed.   History of Present Illness:    Observations/Objective:   Assessment and Plan:   Follow Up Instructions:    I discussed the assessment and treatment plan with the patient. The patient was provided an opportunity to ask questions and all were answered. The patient agreed with the plan and demonstrated an understanding of the instructions.   The patient was advised to call back or seek an in-person evaluation if the symptoms worsen or if the condition fails to improve as anticipated.  I provided 20 minutes of non-face-to-face time during this encounter.  45 years  Patient continues to take lipid medication regularly. No obvious side effects from it. Generally does not miss a dose. Prior blood work results are reviewed with patient. Patient continues to work on fat intake in diet  Blood pressure medicine and blood pressure levels reviewed today with patient. Compliant with blood pressure medicine. States does not miss a  dose. No obvious side effects. Blood pressure generally good when checked elsewhere. Watching salt intake.   Blood pressure medicine and blood pressure levels reviewed today with patient. Compliant with blood pressure medicine. States does not miss a dose. No obvious side effects. Blood pressure generally good when checked elsewhere. Watching salt intake.   Patient continues to take lipid medication regularly. No obvious side effects from it. Generally does not miss a dose. Prior blood work results are reviewed with patient. Patient continues to work on fat intake in diet    Review of Systems No headache, no major weight loss or weight gain, no chest pain no back pain abdominal pain no change in bowel habits complete ROS otherwise negative     Objective:   Physical Exam Virtual       Assessment & Plan:  Impression 1 hypertension.  Good control when checked elsewhere.  Compliance discussed.  Diet exercise discussed  2.  Hyperlipidemia.  Scanned blood work also excellent control of normal liver enzymes patient maintain  6 months work.  Diet exercise discussed follow-up

## 2019-12-25 ENCOUNTER — Other Ambulatory Visit: Payer: Self-pay | Admitting: Family Medicine

## 2020-01-02 ENCOUNTER — Other Ambulatory Visit: Payer: Self-pay | Admitting: Family Medicine

## 2020-01-03 ENCOUNTER — Encounter: Payer: Self-pay | Admitting: Family Medicine

## 2020-01-13 ENCOUNTER — Other Ambulatory Visit: Payer: Self-pay | Admitting: Family Medicine

## 2020-01-14 ENCOUNTER — Ambulatory Visit (HOSPITAL_COMMUNITY)
Admission: RE | Admit: 2020-01-14 | Discharge: 2020-01-14 | Disposition: A | Discharge status: Home / Self Care | Payer: BC Managed Care – PPO | Source: Ambulatory Visit | Attending: Family Medicine | Admitting: Family Medicine

## 2020-01-14 ENCOUNTER — Other Ambulatory Visit: Payer: Self-pay

## 2020-01-14 DIAGNOSIS — Z1231 Encounter for screening mammogram for malignant neoplasm of breast: Secondary | ICD-10-CM

## 2020-03-05 ENCOUNTER — Other Ambulatory Visit: Payer: Self-pay | Admitting: Family Medicine

## 2020-05-28 ENCOUNTER — Encounter: Payer: Self-pay | Admitting: Family Medicine

## 2020-05-28 ENCOUNTER — Ambulatory Visit (INDEPENDENT_AMBULATORY_CARE_PROVIDER_SITE_OTHER): Payer: 59 | Admitting: Family Medicine

## 2020-05-28 ENCOUNTER — Encounter (INDEPENDENT_AMBULATORY_CARE_PROVIDER_SITE_OTHER): Payer: Self-pay | Admitting: *Deleted

## 2020-05-28 ENCOUNTER — Other Ambulatory Visit: Payer: Self-pay

## 2020-05-28 VITALS — BP 128/76 | HR 64 | Temp 97.3°F | Ht 60.0 in | Wt 168.8 lb

## 2020-05-28 DIAGNOSIS — E559 Vitamin D deficiency, unspecified: Secondary | ICD-10-CM

## 2020-05-28 DIAGNOSIS — E785 Hyperlipidemia, unspecified: Secondary | ICD-10-CM | POA: Diagnosis not present

## 2020-05-28 DIAGNOSIS — Z1211 Encounter for screening for malignant neoplasm of colon: Secondary | ICD-10-CM | POA: Diagnosis not present

## 2020-05-28 DIAGNOSIS — I10 Essential (primary) hypertension: Secondary | ICD-10-CM | POA: Diagnosis not present

## 2020-05-28 MED ORDER — AMLODIPINE BESYLATE 5 MG PO TABS: 5.0000 mg | ORAL_TABLET | Freq: Every day | ORAL | 1 refills | Status: DC

## 2020-05-28 MED ORDER — LISINOPRIL 5 MG PO TABS: 5.0000 mg | ORAL_TABLET | Freq: Every day | ORAL | 1 refills | Status: DC

## 2020-05-28 NOTE — Progress Notes (Signed)
 Patient ID: Erin Foster, female    DOB: 11/12/1956, 63 y.o.   MRN: [id number]   Chief Complaint  Patient presents with  . Hypertension    Pt here for blood pressure follow up. Pt has not been checking blood pressure at home at this time. No issues; doing well on medication.    Subjective:    HPI  HTN Pt compliant with BP meds.  No SEs Denies chest pain, sob, LE swelling, or blurry vision.   HLD- doing well no new concerns.  Compliant with meds. No chest pain, palpitations, myalgias or joint pains.   Vit D defic- was low in past at 20.7, in 2016.  Not currently taking medications.  Medical History Erin Foster has a past medical history of Asthma, CVA (cerebral vascular accident) (HCC), Hyperlipidemia, and Hypertension.   Outpatient Encounter Medications as of 05/28/2020  Medication Sig  . amLODipine (NORVASC) 5 MG tablet Take 1 tablet (5 mg total) by mouth daily.  . aspirin 81 MG tablet Take 81 mg by mouth daily.  . lisinopril (ZESTRIL) 5 MG tablet Take 1 tablet (5 mg total) by mouth daily.  . simvastatin (ZOCOR) 20 MG tablet Take 1 tablet (20 mg total) by mouth every evening.  . triamterene-hydrochlorothiazide (DYAZIDE) 37.5-25 MG capsule Take 1 each (1 capsule total) by mouth daily.  . [DISCONTINUED] amLODipine (NORVASC) 5 MG tablet TAKE 1 TABLET BY MOUTH  DAILY  . [DISCONTINUED] lisinopril (ZESTRIL) 5 MG tablet TAKE 1 TABLET BY MOUTH  DAILY  . [DISCONTINUED] simvastatin (ZOCOR) 20 MG tablet TAKE 1 TABLET BY MOUTH IN  THE EVENING  . [DISCONTINUED] triamterene-hydrochlorothiazide (DYAZIDE) 37.5-25 MG capsule TAKE 1 CAPSULE BY MOUTH IN  THE MORNING   No facility-administered encounter medications on file as of 05/28/2020.     Review of Systems  Constitutional: Negative for chills and fever.  HENT: Negative for congestion, rhinorrhea and sore throat.   Respiratory: Negative for cough, shortness of breath and wheezing.   Cardiovascular: Negative for chest pain and leg  swelling.  Gastrointestinal: Negative for abdominal pain, diarrhea, nausea and vomiting.  Genitourinary: Negative for dysuria and frequency.  Musculoskeletal: Negative for arthralgias and back pain.  Skin: Negative for rash.  Neurological: Negative for dizziness, weakness and headaches.     Vitals BP 128/76   Pulse 64   Temp (!) 97.3 F (36.3 C)   Ht 5' (1.524 m)   Wt 168 lb 12.8 oz (76.6 kg)   SpO2 98%   BMI 32.97 kg/m   Objective:   Physical Exam Vitals and nursing note reviewed.  Constitutional:      General: She is not in acute distress.    Appearance: Normal appearance.  HENT:     Head: Normocephalic and atraumatic.  Eyes:     Extraocular Movements: Extraocular movements intact.     Conjunctiva/sclera: Conjunctivae normal.     Pupils: Pupils are equal, round, and reactive to light.  Cardiovascular:     Rate and Rhythm: Normal rate and regular rhythm.     Pulses: Normal pulses.     Heart sounds: Normal heart sounds. No murmur heard.   Pulmonary:     Effort: Pulmonary effort is normal.     Breath sounds: Normal breath sounds. No wheezing, rhonchi or rales.  Musculoskeletal:        General: Normal range of motion.     Right lower leg: No edema.     Left lower leg: No edema.  Skin:    General: Skin   is warm and dry.     Findings: No lesion or rash.  Neurological:     General: No focal deficit present.     Mental Status: She is alert and oriented to person, place, and time.     Cranial Nerves: No cranial nerve deficit.  Psychiatric:        Mood and Affect: Mood normal.        Behavior: Behavior normal.      Assessment and Plan   1. Hyperlipidemia LDL goal <70 - CBC - CMP14+EGFR - Lipid panel - simvastatin (ZOCOR) 20 MG tablet; Take 1 tablet (20 mg total) by mouth every evening.  Dispense: 90 tablet; Refill: 0  2. Essential hypertension, benign - CBC - CMP14+EGFR - Lipid panel - triamterene-hydrochlorothiazide (DYAZIDE) 37.5-25 MG capsule; Take 1  each (1 capsule total) by mouth daily.  Dispense: 90 capsule; Refill: 1  3. Vitamin D insufficiency - Vitamin D 1,25 dihydroxy  4. Colon cancer screening - Ambulatory referral to Gastroenterology   Pt to get labs today, will need refill on zocor and triamterene/hctz dep on lab work.  HTN- stable, cont meds.  HLD- stable, will recheck labs.   Vit D- recheck labs. Not currently taking vit d.  F/u 6mo 

## 2020-06-01 MED ORDER — TRIAMTERENE-HCTZ 37.5-25 MG PO CAPS: 1.0000 | ORAL_CAPSULE | Freq: Every day | ORAL | 1 refills | Status: DC

## 2020-06-01 MED ORDER — SIMVASTATIN 20 MG PO TABS: 20.0000 mg | ORAL_TABLET | Freq: Every evening | ORAL | 0 refills | Status: DC

## 2020-06-11 LAB — CBC
Hematocrit: 37.2 % (ref 34.0–46.6)
Hemoglobin: 12.3 g/dL (ref 11.1–15.9)
MCH: 26.5 pg — ABNORMAL LOW (ref 26.6–33.0)
MCHC: 33.1 g/dL (ref 31.5–35.7)
MCV: 80 fL (ref 79–97)
Platelets: 210 10*3/uL (ref 150–450)
RBC: 4.64 x10E6/uL (ref 3.77–5.28)
RDW: 14.4 % (ref 11.7–15.4)
WBC: 4.7 10*3/uL (ref 3.4–10.8)

## 2020-06-11 LAB — VITAMIN D 1,25 DIHYDROXY
Vitamin D 1, 25 (OH)2 Total: 62 pg/mL
Vitamin D2 1, 25 (OH)2: 16 pg/mL
Vitamin D3 1, 25 (OH)2: 46 pg/mL

## 2020-06-11 LAB — CMP14+EGFR
ALT: 21 IU/L (ref 0–32)
AST: 23 IU/L (ref 0–40)
Albumin/Globulin Ratio: 1.8 (ref 1.2–2.2)
Albumin: 4.2 g/dL (ref 3.8–4.8)
Alkaline Phosphatase: 78 IU/L (ref 48–121)
BUN/Creatinine Ratio: 17 (ref 12–28)
BUN: 16 mg/dL (ref 8–27)
Bilirubin Total: 0.6 mg/dL (ref 0.0–1.2)
CO2: 24 mmol/L (ref 20–29)
Calcium: 9.5 mg/dL (ref 8.7–10.3)
Chloride: 105 mmol/L (ref 96–106)
Creatinine, Ser: 0.94 mg/dL (ref 0.57–1.00)
GFR calc Af Amer: 75 mL/min/{1.73_m2} (ref >59)
GFR calc non Af Amer: 65 mL/min/{1.73_m2} (ref >59)
Globulin, Total: 2.3 g/dL (ref 1.5–4.5)
Glucose: 79 mg/dL (ref 65–99)
Potassium: 4.2 mmol/L (ref 3.5–5.2)
Sodium: 143 mmol/L (ref 134–144)
Total Protein: 6.5 g/dL (ref 6.0–8.5)

## 2020-06-11 LAB — LIPID PANEL
Chol/HDL Ratio: 2.3 ratio (ref 0.0–4.4)
Cholesterol, Total: 215 mg/dL — ABNORMAL HIGH (ref 100–199)
HDL: 93 mg/dL (ref >39)
LDL Chol Calc (NIH): 111 mg/dL — ABNORMAL HIGH (ref 0–99)
Triglycerides: 63 mg/dL (ref 0–149)
VLDL Cholesterol Cal: 11 mg/dL (ref 5–40)

## 2020-08-07 ENCOUNTER — Telehealth: Payer: Self-pay | Admitting: Family Medicine

## 2020-08-07 DIAGNOSIS — I10 Essential (primary) hypertension: Secondary | ICD-10-CM

## 2020-08-07 MED ORDER — TRIAMTERENE-HCTZ 37.5-25 MG PO CAPS: 1.0000 | ORAL_CAPSULE | Freq: Every day | ORAL | 1 refills | Status: DC

## 2020-08-07 NOTE — Telephone Encounter (Signed)
Pt is requesting refill on triamterene-hydrochlorothiazide (DYAZIDE) 37.5-25 MG capsule   [facility] - [place], [place] - [address]

## 2020-08-26 ENCOUNTER — Telehealth: Payer: Self-pay

## 2020-08-26 MED ORDER — AMLODIPINE BESYLATE 5 MG PO TABS: 5.0000 mg | ORAL_TABLET | Freq: Every day | ORAL | 1 refills | Status: DC

## 2020-08-26 NOTE — Telephone Encounter (Signed)
Pt needs refills on amLODipine (NORVASC) 5 MG tablet [facility] - [place], [place]   Pt call back [phone]

## 2020-08-26 NOTE — Telephone Encounter (Signed)
Please advise. Thank you

## 2020-08-26 NOTE — Telephone Encounter (Signed)
Attempted to contact patient; no answering service.

## 2020-08-28 NOTE — Telephone Encounter (Signed)
Tried to call no answer

## 2020-09-17 ENCOUNTER — Other Ambulatory Visit: Payer: Self-pay

## 2020-09-17 MED ORDER — LISINOPRIL 5 MG PO TABS: 5.0000 mg | ORAL_TABLET | Freq: Every day | ORAL | 1 refills | Status: DC

## 2020-09-17 NOTE — Telephone Encounter (Signed)
Pt needs refill on lisinopril (ZESTRIL) 5 MG tablet, simvastatin (ZOCOR) 20 MG tablet [facility] - [place], [place] - [address]   Pt call back [phone]

## 2020-11-27 ENCOUNTER — Ambulatory Visit: Payer: 59 | Admitting: Family Medicine

## 2020-12-22 ENCOUNTER — Other Ambulatory Visit: Payer: Self-pay | Admitting: Family Medicine

## 2020-12-22 DIAGNOSIS — E785 Hyperlipidemia, unspecified: Secondary | ICD-10-CM

## 2021-01-06 ENCOUNTER — Other Ambulatory Visit (HOSPITAL_COMMUNITY): Payer: Self-pay | Admitting: Family Medicine

## 2021-01-06 DIAGNOSIS — Z1231 Encounter for screening mammogram for malignant neoplasm of breast: Secondary | ICD-10-CM

## 2021-01-07 ENCOUNTER — Telehealth: Payer: Self-pay | Admitting: Family Medicine

## 2021-01-07 DIAGNOSIS — E785 Hyperlipidemia, unspecified: Secondary | ICD-10-CM

## 2021-01-07 DIAGNOSIS — E559 Vitamin D deficiency, unspecified: Secondary | ICD-10-CM

## 2021-01-07 DIAGNOSIS — I10 Essential (primary) hypertension: Secondary | ICD-10-CM

## 2021-01-07 NOTE — Telephone Encounter (Signed)
Patient needing labs for physical on 2/25

## 2021-01-08 NOTE — Telephone Encounter (Signed)
Yes pls order those. Dr. Sander Remedios  

## 2021-01-08 NOTE — Telephone Encounter (Signed)
Blood work ordered in [employer] [facility] [employer]. (phone number is wrong in chart-will verify phone number at office visit)

## 2021-01-16 ENCOUNTER — Other Ambulatory Visit: Payer: Self-pay

## 2021-01-16 ENCOUNTER — Ambulatory Visit (HOSPITAL_COMMUNITY)
Admission: RE | Admit: 2021-01-16 | Discharge: 2021-01-16 | Disposition: A | Discharge status: Home / Self Care | Payer: 59 | Source: Ambulatory Visit | Attending: Family Medicine | Admitting: Family Medicine

## 2021-01-16 DIAGNOSIS — Z1231 Encounter for screening mammogram for malignant neoplasm of breast: Secondary | ICD-10-CM | POA: Diagnosis not present

## 2021-01-23 ENCOUNTER — Other Ambulatory Visit: Payer: Self-pay

## 2021-01-23 ENCOUNTER — Ambulatory Visit (INDEPENDENT_AMBULATORY_CARE_PROVIDER_SITE_OTHER): Payer: 59 | Admitting: Family Medicine

## 2021-01-23 VITALS — BP 130/78 | HR 76 | Temp 97.0°F | Ht 60.0 in | Wt 172.0 lb

## 2021-01-23 DIAGNOSIS — Z124 Encounter for screening for malignant neoplasm of cervix: Secondary | ICD-10-CM

## 2021-01-23 DIAGNOSIS — Z0000 Encounter for general adult medical examination without abnormal findings: Secondary | ICD-10-CM

## 2021-01-23 DIAGNOSIS — E785 Hyperlipidemia, unspecified: Secondary | ICD-10-CM

## 2021-01-23 DIAGNOSIS — I10 Essential (primary) hypertension: Secondary | ICD-10-CM

## 2021-01-23 DIAGNOSIS — Z01419 Encounter for gynecological examination (general) (routine) without abnormal findings: Secondary | ICD-10-CM

## 2021-01-23 MED ORDER — AMLODIPINE BESYLATE 5 MG PO TABS: 5.0000 mg | ORAL_TABLET | Freq: Every day | ORAL | 1 refills | Status: DC

## 2021-01-23 MED ORDER — SIMVASTATIN 20 MG PO TABS: 20.0000 mg | ORAL_TABLET | Freq: Every evening | ORAL | 1 refills | Status: DC

## 2021-01-23 MED ORDER — LISINOPRIL 5 MG PO TABS: 5.0000 mg | ORAL_TABLET | Freq: Every day | ORAL | 1 refills | Status: DC

## 2021-01-23 MED ORDER — TRIAMTERENE-HCTZ 37.5-25 MG PO CAPS: 1.0000 | ORAL_CAPSULE | Freq: Every day | ORAL | 1 refills | Status: DC

## 2021-01-23 NOTE — Progress Notes (Signed)
Patient ID: Erin Foster, female    DOB: [birth year], 65 y.o.   MRN: [id number]   Chief Complaint  Patient presents with  . Annual Exam   Subjective:    HPI  The patient comes in today for a wellness visit.    A review of their health history was completed.  A review of medications was also completed.  Any needed refills; yes  Eating habits: eating ok  Falls/  MVA accidents in past few months: none  Regular exercise: walk sometimes  Specialist pt sees on regular basis: none  Needs colonoscopy, pt is over due. H/o Chronic constipation. Drinking lots of prune juice.  Since a child had this.  Pt will call for GI to schedule the colonoscopy.  Preventative health issues were discussed.   Additional concerns: very achy for 4 days  Pt needs labs.  Pt had colonoscopy at 65 yrs old. Over due for there next one.  No blood or black stools. occ having achiness in hips down to knees both sides.  No falls.  Thinking weight causing it. meds- aleve.  Helping.  Medical History Eyleen has a past medical history of Asthma, CVA (cerebral vascular accident) ([facility]), Hyperlipidemia, and Hypertension.   Outpatient Encounter Medications as of 01/23/2021  Medication Sig  . amLODipine (NORVASC) 5 MG tablet Take 1 tablet (5 mg total) by mouth daily.  Marland Kitchen aspirin 81 MG tablet Take 81 mg by mouth daily.  Marland Kitchen lisinopril (ZESTRIL) 5 MG tablet Take 1 tablet (5 mg total) by mouth daily.  . simvastatin (ZOCOR) 20 MG tablet Take 1 tablet (20 mg total) by mouth every evening.  . triamterene-hydrochlorothiazide (DYAZIDE) 37.5-25 MG capsule Take 1 each (1 capsule total) by mouth daily.  . [DISCONTINUED] amLODipine (NORVASC) 5 MG tablet Take 1 tablet (5 mg total) by mouth daily.  . [DISCONTINUED] lisinopril (ZESTRIL) 5 MG tablet Take 1 tablet (5 mg total) by mouth daily.  . [DISCONTINUED] simvastatin (ZOCOR) 20 MG tablet TAKE 1 TABLET BY MOUTH ONCE DAILY IN THE EVENING  . [DISCONTINUED]  triamterene-hydrochlorothiazide (DYAZIDE) 37.5-25 MG capsule Take 1 each (1 capsule total) by mouth daily.   No facility-administered encounter medications on file as of 01/23/2021.     Review of Systems  Constitutional: Negative for chills and fever.  HENT: Negative for congestion, rhinorrhea and sore throat.   Respiratory: Negative for cough, shortness of breath and wheezing.   Cardiovascular: Negative for chest pain and leg swelling.  Gastrointestinal: Negative for abdominal pain, diarrhea, nausea and vomiting.  Genitourinary: Negative for dysuria and frequency.  Musculoskeletal: Negative for arthralgias and back pain.  Skin: Negative for rash.  Neurological: Negative for dizziness, weakness and headaches.     Vitals BP 130/78   Pulse 76   Temp (!) 97 F (36.1 C) (Oral)   Ht 5' (1.524 m)   Wt 172 lb (78 kg)   SpO2 100%   BMI 33.59 kg/m   Objective:   Physical Exam Vitals and nursing note reviewed.  Constitutional:      Appearance: Normal appearance.  HENT:     Head: Normocephalic and atraumatic.     Nose: Nose normal.     Mouth/Throat:     Mouth: Mucous membranes are moist.     Pharynx: Oropharynx is clear.  Eyes:     Extraocular Movements: Extraocular movements intact.     Conjunctiva/sclera: Conjunctivae normal.     Pupils: Pupils are equal, round, and reactive to light.  Cardiovascular:  Rate and Rhythm: Normal rate and regular rhythm.     Pulses: Normal pulses.     Heart sounds: Normal heart sounds.  Pulmonary:     Effort: Pulmonary effort is normal.     Breath sounds: Normal breath sounds. No wheezing, rhonchi or rales.  Musculoskeletal:        General: Normal range of motion.     Right lower leg: No edema.     Left lower leg: No edema.  Skin:    General: Skin is warm and dry.     Findings: No lesion or rash.  Neurological:     General: No focal deficit present.     Mental Status: She is alert and oriented to person, place, and time.   Psychiatric:        Mood and Affect: Mood normal.        Behavior: Behavior normal.      Assessment and Plan   1. Well woman exam with routine gynecological exam  2. Laboratory tests ordered as part of a complete physical exam (CPE) - CBC - CMP14+EGFR - Lipid panel - TSH  3. Hyperlipidemia LDL goal <70 - simvastatin (ZOCOR) 20 MG tablet; Take 1 tablet (20 mg total) by mouth every evening.  Dispense: 90 tablet; Refill: 1  4. Essential hypertension, benign - triamterene-hydrochlorothiazide (DYAZIDE) 37.5-25 MG capsule; Take 1 each (1 capsule total) by mouth daily.  Dispense: 90 capsule; Refill: 1  5. Screening for cervical cancer - IGP, Aptima HPV   Labs ordered today. Pt will call for colonoscopy.  Pt is overdue.   htn- stable. Cont meds. Labs ordered. hld- stable. Cont meds.  F/u [address] prn.

## 2021-01-25 ENCOUNTER — Other Ambulatory Visit: Payer: Self-pay | Admitting: Family Medicine

## 2021-01-27 LAB — LIPID PANEL
Chol/HDL Ratio: 2.2 ratio (ref 0.0–4.4)
Cholesterol, Total: 221 mg/dL — ABNORMAL HIGH (ref 100–199)
HDL: 99 mg/dL (ref >39)
LDL Chol Calc (NIH): 111 mg/dL — ABNORMAL HIGH (ref 0–99)
Triglycerides: 63 mg/dL (ref 0–149)
VLDL Cholesterol Cal: 11 mg/dL (ref 5–40)

## 2021-01-27 LAB — CMP14+EGFR
ALT: 24 IU/L (ref 0–32)
AST: 22 IU/L (ref 0–40)
Albumin/Globulin Ratio: 1.3 (ref 1.2–2.2)
Albumin: 4.2 g/dL (ref 3.8–4.8)
Alkaline Phosphatase: 70 IU/L (ref 44–121)
BUN/Creatinine Ratio: 14 (ref 12–28)
BUN: 15 mg/dL (ref 8–27)
Bilirubin Total: 0.8 mg/dL (ref 0.0–1.2)
CO2: 21 mmol/L (ref 20–29)
Calcium: 9.6 mg/dL (ref 8.7–10.3)
Chloride: 101 mmol/L (ref 96–106)
Creatinine, Ser: 1.1 mg/dL — ABNORMAL HIGH (ref 0.57–1.00)
Globulin, Total: 3.2 g/dL (ref 1.5–4.5)
Glucose: 88 mg/dL (ref 65–99)
Potassium: 4.1 mmol/L (ref 3.5–5.2)
Sodium: 136 mmol/L (ref 134–144)
Total Protein: 7.4 g/dL (ref 6.0–8.5)
eGFR: 56 mL/min/{1.73_m2} — ABNORMAL LOW (ref >59)

## 2021-01-27 LAB — TSH: TSH: 1.75 u[IU]/mL (ref 0.450–4.500)

## 2021-01-27 LAB — CBC
Hematocrit: 40.9 % (ref 34.0–46.6)
Hemoglobin: 13.7 g/dL (ref 11.1–15.9)
MCH: 26.9 pg (ref 26.6–33.0)
MCHC: 33.5 g/dL (ref 31.5–35.7)
MCV: 80 fL (ref 79–97)
Platelets: 225 10*3/uL (ref 150–450)
RBC: 5.09 x10E6/uL (ref 3.77–5.28)
RDW: 13.6 % (ref 11.7–15.4)
WBC: 5.1 10*3/uL (ref 3.4–10.8)

## 2021-01-28 LAB — IGP, APTIMA HPV: HPV Aptima: NEGATIVE

## 2021-01-28 LAB — SPECIMEN STATUS REPORT

## 2021-02-03 ENCOUNTER — Encounter: Payer: Self-pay | Admitting: Family Medicine

## 2021-02-06 ENCOUNTER — Encounter (HOSPITAL_COMMUNITY): Payer: Self-pay

## 2021-02-06 ENCOUNTER — Other Ambulatory Visit: Payer: Self-pay

## 2021-02-06 ENCOUNTER — Emergency Department (HOSPITAL_COMMUNITY)
Admission: EM | Admit: 2021-02-06 | Discharge: 2021-02-06 | Disposition: A | Discharge status: Home / Self Care | Payer: 59 | Attending: Emergency Medicine | Admitting: Emergency Medicine

## 2021-02-06 DIAGNOSIS — I10 Essential (primary) hypertension: Secondary | ICD-10-CM | POA: Insufficient documentation

## 2021-02-06 DIAGNOSIS — J45909 Unspecified asthma, uncomplicated: Secondary | ICD-10-CM | POA: Insufficient documentation

## 2021-02-06 DIAGNOSIS — Z7982 Long term (current) use of aspirin: Secondary | ICD-10-CM | POA: Insufficient documentation

## 2021-02-06 DIAGNOSIS — Z79899 Other long term (current) drug therapy: Secondary | ICD-10-CM | POA: Diagnosis not present

## 2021-02-06 DIAGNOSIS — M5432 Sciatica, left side: Secondary | ICD-10-CM | POA: Insufficient documentation

## 2021-02-06 DIAGNOSIS — M7602 Gluteal tendinitis, left hip: Secondary | ICD-10-CM | POA: Diagnosis present

## 2021-02-06 MED ORDER — HYDROCODONE-ACETAMINOPHEN 5-325 MG PO TABS: 1.0000 | ORAL_TABLET | ORAL | 0 refills | Status: AC | PRN

## 2021-02-06 MED ORDER — PREDNISONE 10 MG PO TABS: ORAL_TABLET | ORAL | 0 refills | Status: DC

## 2021-02-06 NOTE — ED Triage Notes (Signed)
Pt reports pain in left buttock radiating down left leg since Wednesday afternoon.  Denies injury.  No obvious swelling or deformity noted to left leg.  Pedal pulse present.  Skin warm and dry.

## 2021-02-06 NOTE — Discharge Instructions (Addendum)
Return if any problems. See your Physician for recheck next week  

## 2021-02-06 NOTE — ED Provider Notes (Signed)
[facility] EMERGENCY DEPARTMENT Provider Note   CSN: [id number] Arrival date & time: 02/06/21  0944     History No chief complaint on file.   Erin Foster is a 65 y.o. female.  The history is provided by the patient. No language interpreter was used.  Back Pain Location:  Gluteal region Quality:  Aching Radiates to:  L posterior upper leg Pain severity:  Moderate Pain is:  Worse during the night Onset quality:  Gradual Timing:  Constant Progression:  Worsening Chronicity:  New Relieved by:  Nothing Worsened by:  Nothing Ineffective treatments:  None tried Associated symptoms: leg pain    Pt complains of pain going down her left buttock and leg.  Pt reports she was cleaning her house when symptoms started.     Past Medical History:  Diagnosis Date  . Asthma   . CVA (cerebral vascular accident) (HCC)   . Hyperlipidemia   . Hypertension     Patient Active Problem List   Diagnosis Date Noted  . Postmenopausal bleeding 12/30/2013  . Vitamin D insufficiency 12/30/2013  . Essential hypertension, benign 07/24/2013  . Hyperlipidemia LDL goal <70 07/24/2013  . History of CVA (cerebrovascular accident) 07/24/2013    Past Surgical History:  Procedure Laterality Date  . COLONOSCOPY       OB History   No obstetric history on file.     Family History  Problem Relation Age of Onset  . Diabetes Mother   . Hypertension Mother   . Hyperlipidemia Mother   . Cancer Maternal Aunt 50       breast  . Hypertension Sister   . Hypertension Sister     Social History   Tobacco Use  . Smoking status: Never Smoker  . Smokeless tobacco: Never Used  Substance Use Topics  . Alcohol use: No    Alcohol/week: 0.0 standard drinks  . Drug use: No    Home Medications Prior to Admission medications   Medication Sig Start Date End Date Taking? Authorizing Provider  HYDROcodone-acetaminophen (NORCO/VICODIN) 5-325 MG tablet Take 1 tablet by mouth every 4 (four) hours as  needed for up to 5 days for moderate pain. 02/06/21 02/11/21 Yes Cheron Schaumann K, PA-C  predniSONE (DELTASONE) 10 MG tablet [id number] taper 02/06/21  Yes Elson Areas, PA-C  amLODipine (NORVASC) 5 MG tablet Take 1 tablet (5 mg total) by mouth daily. 01/23/21   Laroy Apple M, DO  aspirin 81 MG tablet Take 81 mg by mouth daily.    [provider]  lisinopril (ZESTRIL) 5 MG tablet Take 1 tablet (5 mg total) by mouth daily. 01/23/21   Laroy Apple M, DO  simvastatin (ZOCOR) 20 MG tablet Take 1 tablet (20 mg total) by mouth every evening. 01/23/21   Laroy Apple M, DO  triamterene-hydrochlorothiazide (DYAZIDE) 37.5-25 MG capsule Take 1 each (1 capsule total) by mouth daily. 01/23/21   Annalee Genta, DO    Allergies    Patient has no known allergies.  Review of Systems   Review of Systems  Musculoskeletal: Positive for back pain.  All other systems reviewed and are negative.   Physical Exam Updated Vital Signs BP 132/80   Pulse 78   Temp 98 F (36.7 C) (Oral)   Resp 16   Ht 5' (1.524 m)   Wt 77.1 kg   SpO2 99%   BMI 33.20 kg/m   Physical Exam Vitals and nursing note reviewed.  Constitutional:      Appearance: She is well-developed.  HENT:     Head: Normocephalic.  Cardiovascular:     Rate and Rhythm: Normal rate and regular rhythm.  Pulmonary:     Effort: Pulmonary effort is normal.  Abdominal:     General: There is no distension.  Musculoskeletal:        General: Normal range of motion.     Cervical back: Normal range of motion.  Skin:    General: Skin is warm.  Neurological:     General: No focal deficit present.     Mental Status: She is alert and oriented to person, place, and time.  Psychiatric:        Mood and Affect: Mood normal.     ED Results / Procedures / Treatments   Labs (all labs ordered are listed, but only abnormal results are displayed) Labs Reviewed - No data to display  EKG None  Radiology No results  found.  Procedures Procedures   Medications Ordered in ED Medications - No data to display  ED Course  I have reviewed the triage vital signs and the nursing notes.  Pertinent labs & imaging results that were available during my care of the patient were reviewed by me and considered in my medical decision making (see chart for details).    MDM Rules/Calculators/A&P                         MDM:  Pt counseled on sciatica.  Pt given rx for prednisone and hydrocodone.    Final Clinical Impression(s) / ED Diagnoses Final diagnoses:  Sciatica, left side    Rx / DC Orders ED Discharge Orders         Ordered    predniSONE (DELTASONE) 10 MG tablet        02/06/21 1233    HYDROcodone-acetaminophen (NORCO/VICODIN) 5-325 MG tablet  Every 4 hours PRN        02/06/21 1236        An After Visit Summary was printed and given to the patient.    Osie Cheeks 02/06/21 Johny Shock, MD 02/06/21 2315

## 2021-03-31 ENCOUNTER — Other Ambulatory Visit: Payer: Self-pay | Admitting: Family Medicine

## 2021-03-31 DIAGNOSIS — E785 Hyperlipidemia, unspecified: Secondary | ICD-10-CM

## 2021-04-21 ENCOUNTER — Encounter: Payer: Self-pay | Admitting: Emergency Medicine

## 2021-04-21 ENCOUNTER — Ambulatory Visit
Admission: EM | Admit: 2021-04-21 | Discharge: 2021-04-21 | Disposition: A | Discharge status: Home / Self Care | Payer: 59 | Attending: Family Medicine | Admitting: Family Medicine

## 2021-04-21 ENCOUNTER — Other Ambulatory Visit: Payer: Self-pay

## 2021-04-21 DIAGNOSIS — B349 Viral infection, unspecified: Secondary | ICD-10-CM

## 2021-04-21 DIAGNOSIS — U071 COVID-19: Secondary | ICD-10-CM

## 2021-04-21 NOTE — Discharge Instructions (Addendum)
Your COVID and Influenza tests are pending.  You should self quarantine until the test results are back.    Take Tylenol or ibuprofen as needed for fever or discomfort.  Rest and keep yourself hydrated.    Follow-up with your primary care provider if your symptoms are not improving.     

## 2021-04-21 NOTE — ED Provider Notes (Signed)
[facility]    CSN: [id number] Arrival date & time: 04/21/21  1404      History   Chief Complaint No chief complaint on file.   HPI Erin Foster is a 65 y.o. female.   Reports nasal congestion, cough, scratchy throat and runny nose since yesterday.  Has taken ibuprofen and Tylenol for aches and pains with relief.  Reports that she had a positive COVID test at [facility] yesterday.  Denies known sick contacts.  Has had negative history of COVID.  Has completed COVID vaccines, no booster.  Has not completed flu vaccine.  Denies headache, chills, nausea, vomiting, abdominal pain, diarrhea, rash, fever, other symptoms.  ROS per HPI  The history is provided by the patient.    Past Medical History:  Diagnosis Date  . Asthma   . CVA (cerebral vascular accident) (HCC)   . Hyperlipidemia   . Hypertension     Patient Active Problem List   Diagnosis Date Noted  . Postmenopausal bleeding 12/30/2013  . Vitamin D insufficiency 12/30/2013  . Essential hypertension, benign 07/24/2013  . Hyperlipidemia LDL goal <70 07/24/2013  . History of CVA (cerebrovascular accident) 07/24/2013    Past Surgical History:  Procedure Laterality Date  . COLONOSCOPY      OB History   No obstetric history on file.      Home Medications    Prior to Admission medications   Medication Sig Start Date End Date Taking? Authorizing Provider  amLODipine (NORVASC) 5 MG tablet Take 1 tablet (5 mg total) by mouth daily. 01/23/21   Laroy Apple M, DO  aspirin 81 MG tablet Take 81 mg by mouth daily.    [provider]  lisinopril (ZESTRIL) 5 MG tablet Take 1 tablet (5 mg total) by mouth daily. 01/23/21   Laroy Apple M, DO  predniSONE (DELTASONE) 10 MG tablet 6.5.4.3.2.1 taper 02/06/21   Cheron Schaumann K, PA-C  simvastatin (ZOCOR) 20 MG tablet TAKE 1 TABLET BY MOUTH ONCE DAILY IN THE EVENING 03/31/21   Ladona Ridgel, Malena M, DO  triamterene-hydrochlorothiazide (DYAZIDE) 37.5-25 MG  capsule Take 1 each (1 capsule total) by mouth daily. 01/23/21   Annalee Genta, DO    Family History Family History  Problem Relation Age of Onset  . Diabetes Mother   . Hypertension Mother   . Hyperlipidemia Mother   . Cancer Maternal Aunt 50       breast  . Hypertension Sister   . Hypertension Sister     Social History Social History   Tobacco Use  . Smoking status: Never Smoker  . Smokeless tobacco: Never Used  Substance Use Topics  . Alcohol use: No    Alcohol/week: 0.0 standard drinks  . Drug use: No     Allergies   Patient has no known allergies.   Review of Systems Review of Systems   Physical Exam Triage Vital Signs ED Triage Vitals  Enc Vitals Group     BP 04/21/21 1427 128/70     Pulse Rate 04/21/21 1427 66     Resp 04/21/21 1427 16     Temp 04/21/21 1427 97.9 F (36.6 C)     Temp Source 04/21/21 1427 Temporal     SpO2 04/21/21 1427 97 %     Weight --      Height --      Head Circumference --      Peak Flow --      Pain Score 04/21/21 1429 3  Pain Loc --      Pain Edu? --      Excl. in GC? --    No data found.  Updated Vital Signs BP 128/70 (BP Location: Left Arm)   Pulse 66   Temp 97.9 F (36.6 C) (Temporal)   Resp 16   SpO2 97%   Visual Acuity Right Eye Distance:   Left Eye Distance:   Bilateral Distance:    Right Eye Near:   Left Eye Near:    Bilateral Near:     Physical Exam Vitals and nursing note reviewed.  Constitutional:      General: She is not in acute distress.    Appearance: Normal appearance. She is well-developed and normal weight. She is ill-appearing.  HENT:     Head: Normocephalic and atraumatic.     Right Ear: Tympanic membrane, ear canal and external ear normal.     Left Ear: Tympanic membrane, ear canal and external ear normal.     Nose: Congestion present.     Mouth/Throat:     Mouth: Mucous membranes are moist.     Pharynx: Posterior oropharyngeal erythema present.  Eyes:     Extraocular  Movements: Extraocular movements intact.     Conjunctiva/sclera: Conjunctivae normal.     Pupils: Pupils are equal, round, and reactive to light.  Cardiovascular:     Rate and Rhythm: Normal rate and regular rhythm.     Heart sounds: Normal heart sounds. No murmur heard.   Pulmonary:     Effort: Pulmonary effort is normal. No respiratory distress.     Breath sounds: Normal breath sounds. No stridor. No wheezing, rhonchi or rales.  Chest:     Chest wall: No tenderness.  Abdominal:     Palpations: Abdomen is soft.     Tenderness: There is no abdominal tenderness.  Musculoskeletal:        General: Normal range of motion.     Cervical back: Normal range of motion and neck supple.  Lymphadenopathy:     Cervical: Cervical adenopathy present.  Skin:    General: Skin is warm and dry.     Capillary Refill: Capillary refill takes less than 2 seconds.  Neurological:     General: No focal deficit present.     Mental Status: She is alert.  Psychiatric:        Mood and Affect: Mood normal.        Thought Content: Thought content normal.      UC Treatments / Results  Labs (all labs ordered are listed, but only abnormal results are displayed) Labs Reviewed  COVID-19, FLU A+B NAA    EKG   Radiology No results found.  Procedures Procedures (including critical care time)  Medications Ordered in UC Medications - No data to display  Initial Impression / Assessment and Plan / UC Course  I have reviewed the triage vital signs and the nursing notes.  Pertinent labs & imaging results that were available during my care of the patient were reviewed by me and considered in my medical decision making (see chart for details).    Viral illness COVID-19  Supportive care at home Work note provided Covid and flu swab obtained in office today.   Patient instructed to quarantine until results are back and negative.   If results are negative, patient may resume daily schedule as  tolerated once they are fever free for 24 hours without the use of antipyretic medications.   If results are positive, patient  instructed to quarantine for at least 5 days from symptom onset.  If after 5 days symptoms have resolved, may return to work with a well fitting mask for the next 5 days. If symptomatic after day 5, isolation should be extended to 10 days. Patient instructed to follow-up with primary care or with this office as needed.   Patient instructed to follow-up in the ER for trouble swallowing, trouble breathing, other concerning symptoms.    Final Clinical Impressions(s) / UC Diagnoses   Final diagnoses:  COVID-19  Viral illness     Discharge Instructions     Your COVID and Influenza tests are pending.  You should self quarantine until the test results are back.    Take Tylenol or ibuprofen as needed for fever or discomfort.  Rest and keep yourself hydrated.    Follow-up with your primary care provider if your symptoms are not improving.         ED Prescriptions    None     PDMP not reviewed this encounter.   Moshe Cipro, NP 04/21/21 [phone]

## 2021-04-21 NOTE — ED Triage Notes (Signed)
Tested covid positive yesterday.  Coughing, scratchy throat and runny nose.

## 2021-04-22 LAB — COVID-19, FLU A+B NAA
Influenza A, NAA: NOT DETECTED
Influenza B, NAA: NOT DETECTED
SARS-CoV-2, NAA: DETECTED — AB

## 2021-04-29 ENCOUNTER — Telehealth: Payer: Self-pay | Admitting: Family Medicine

## 2021-04-29 NOTE — Telephone Encounter (Signed)
Patient is requesting something for dry cough called into [facility]

## 2021-04-29 NOTE — Telephone Encounter (Signed)
Left message for pt to return call for details of Covid Tx and recommendations.

## 2021-04-29 NOTE — Telephone Encounter (Signed)
Patient is requesting something for dry cough called into [facility], Covid positive 05/24/222, no available appts

## 2021-04-30 NOTE — Telephone Encounter (Signed)
Patient has been informed per drs Covid tx and recommendations. She verbalizes understanding.

## 2021-05-20 ENCOUNTER — Other Ambulatory Visit: Payer: Self-pay | Admitting: Family Medicine

## 2021-05-20 ENCOUNTER — Telehealth: Payer: Self-pay | Admitting: Family Medicine

## 2021-05-20 DIAGNOSIS — I10 Essential (primary) hypertension: Secondary | ICD-10-CM

## 2021-05-20 MED ORDER — AMLODIPINE BESYLATE 5 MG PO TABS: 5.0000 mg | ORAL_TABLET | Freq: Every day | ORAL | 0 refills | Status: DC

## 2021-05-20 NOTE — Telephone Encounter (Signed)
Patient is requesting refill on amlodipine 5 mg called into [facility]

## 2021-05-20 NOTE — Telephone Encounter (Signed)
Prescription sent electronically to pharmacy. Patient notified. 

## 2021-05-20 NOTE — Addendum Note (Signed)
Addended by: Margaretha Sheffield on: 05/20/2021 11:49 AM   Modules accepted: Orders

## 2021-10-06 ENCOUNTER — Other Ambulatory Visit: Payer: Self-pay

## 2021-10-06 ENCOUNTER — Ambulatory Visit (INDEPENDENT_AMBULATORY_CARE_PROVIDER_SITE_OTHER): Payer: 59 | Admitting: Family Medicine

## 2021-10-06 VITALS — BP 117/77 | HR 60 | Temp 97.2°F | Ht 60.0 in | Wt 164.0 lb

## 2021-10-06 DIAGNOSIS — Z23 Encounter for immunization: Secondary | ICD-10-CM | POA: Diagnosis not present

## 2021-10-06 DIAGNOSIS — E785 Hyperlipidemia, unspecified: Secondary | ICD-10-CM

## 2021-10-06 DIAGNOSIS — Z8673 Personal history of transient ischemic attack (TIA), and cerebral infarction without residual deficits: Secondary | ICD-10-CM | POA: Diagnosis not present

## 2021-10-06 DIAGNOSIS — I10 Essential (primary) hypertension: Secondary | ICD-10-CM

## 2021-10-06 MED ORDER — SIMVASTATIN 20 MG PO TABS: 20.0000 mg | ORAL_TABLET | Freq: Every evening | ORAL | 1 refills | Status: DC

## 2021-10-06 MED ORDER — AMLODIPINE BESYLATE 5 MG PO TABS: 5.0000 mg | ORAL_TABLET | Freq: Every day | ORAL | 1 refills | Status: DC

## 2021-10-06 MED ORDER — LISINOPRIL 5 MG PO TABS
5.0000 mg | ORAL_TABLET | Freq: Every day | ORAL | 1 refills | Status: DC
Start: 2021-10-06 — End: 2022-04-08

## 2021-10-06 MED ORDER — TRIAMTERENE-HCTZ 37.5-25 MG PO CAPS
1.0000 | ORAL_CAPSULE | Freq: Every day | ORAL | 1 refills | Status: DC
Start: 2021-10-06 — End: 2022-04-08

## 2021-10-06 NOTE — Assessment & Plan Note (Signed)
Not at goal.  Discussed changing statin therapy.  Patient is hesitant and does not want to change it at this time.  Medication was refilled.  Rechecking lipid panel.

## 2021-10-06 NOTE — Assessment & Plan Note (Signed)
Stable/well-controlled.  Continue amlodipine, lisinopril, triamterene HCTZ.  Refilled today.

## 2021-10-06 NOTE — Patient Instructions (Signed)
Labs today.  We will call with results.  I would recommend change in your Simvastatin.  Continue your medications.  Follow up in 6 months.

## 2021-10-06 NOTE — Progress Notes (Signed)
Subjective:  Patient ID: Erin Foster, female    DOB: 16-Jun-1956  Age: 65 y.o. MRN: [id number]  CC: Chief Complaint  Patient presents with   Hypertension   Hyperlipidemia    HPI:  65 year old female with past medical history of hypertension, hyperlipidemia, and CVA presents for follow-up.  Patient states that she has no complaints or concerns at this time.  Her hypertension is well controlled.  She endorses compliance with lisinopril, amlodipine, and triamterene HCTZ.  Hyperlipidemia is uncontrolled and not at goal.  Last LDL was 111.  She endorses compliance with simvastatin.  We will discuss change in therapy today.  Patient states that she is not taking aspirin regularly.  I advised her that this is very important given her prior history of stroke.  Patient Active Problem List   Diagnosis Date Noted   Essential hypertension, benign 07/24/2013   Hyperlipidemia LDL goal <70 07/24/2013   History of CVA (cerebrovascular accident) 07/24/2013    Social Hx   Social History   Socioeconomic History   Marital status: Single    Spouse name: Not on file   Number of children: Not on file   Years of education: Not on file   Highest education level: Not on file  Occupational History   Not on file  Tobacco Use   Smoking status: Never   Smokeless tobacco: Never  Substance and Sexual Activity   Alcohol use: No    Alcohol/week: 0.0 standard drinks   Drug use: No   Sexual activity: Yes    Partners: Male    Birth control/protection: None, Post-menopausal  Other Topics Concern   Not on file  Social History Narrative   Not on file   Social Determinants of Health   Financial Resource Strain: Not on file  Food Insecurity: Not on file  Transportation Needs: Not on file  Physical Activity: Not on file  Stress: Not on file  Social Connections: Not on file    Review of Systems  Constitutional: Negative.   Respiratory: Negative.    Cardiovascular: Negative.      Objective:  BP 117/77   Pulse 60   Temp (!) 97.2 F (36.2 C)   Ht 5' (1.524 m)   Wt 164 lb (74.4 kg)   SpO2 98%   BMI 32.03 kg/m   BP/Weight 10/06/2021 04/21/2021 3/87/5643  Systolic BP 329 518 841  Diastolic BP 77 70 80  Wt. (Lbs) 164 - 170  BMI 32.03 - 33.2    Physical Exam Constitutional:      General: She is not in acute distress.    Appearance: Normal appearance.  HENT:     Head: Normocephalic and atraumatic.  Eyes:     General:        Right eye: No discharge.        Left eye: No discharge.     Conjunctiva/sclera: Conjunctivae normal.  Cardiovascular:     Rate and Rhythm: Normal rate and regular rhythm.     Heart sounds: Murmur heard.  Pulmonary:     Effort: Pulmonary effort is normal.     Breath sounds: Normal breath sounds. No wheezing, rhonchi or rales.  Neurological:     Mental Status: She is alert.  Psychiatric:        Mood and Affect: Mood normal.        Behavior: Behavior normal.    Lab Results  Component Value Date   WBC 5.1 01/26/2021   HGB 13.7 01/26/2021   HCT 40.9  01/26/2021   PLT 225 01/26/2021   GLUCOSE 88 01/26/2021   CHOL 221 (H) 01/26/2021   TRIG 63 01/26/2021   HDL 99 01/26/2021   LDLCALC 111 (H) 01/26/2021   ALT 24 01/26/2021   AST 22 01/26/2021   NA 136 01/26/2021   K 4.1 01/26/2021   CL 101 01/26/2021   CREATININE 1.10 (H) 01/26/2021   BUN 15 01/26/2021   CO2 21 01/26/2021   TSH 1.750 01/26/2021     Assessment & Plan:   Problem List Items Addressed This Visit       Cardiovascular and Mediastinum   Essential hypertension, benign - Primary    Stable/well-controlled.  Continue amlodipine, lisinopril, triamterene HCTZ.  Refilled today.      Relevant Medications   amLODipine (NORVASC) 5 MG tablet   lisinopril (ZESTRIL) 5 MG tablet   simvastatin (ZOCOR) 20 MG tablet   triamterene-hydrochlorothiazide (DYAZIDE) 37.5-25 MG capsule   Other Relevant Orders   CMP14+EGFR     Other   Hyperlipidemia LDL goal <70     Not at goal.  Discussed changing statin therapy.  Patient is hesitant and does not want to change it at this time.  Medication was refilled.  Rechecking lipid panel.      Relevant Medications   amLODipine (NORVASC) 5 MG tablet   lisinopril (ZESTRIL) 5 MG tablet   simvastatin (ZOCOR) 20 MG tablet   triamterene-hydrochlorothiazide (DYAZIDE) 37.5-25 MG capsule   Other Relevant Orders   Lipid panel   History of CVA (cerebrovascular accident)    Advised compliance with therapy including aspirin therapy.      Other Visit Diagnoses     Immunization due       Relevant Orders   Flu Vaccine QUAD 74moIM (Fluarix, Fluzone & Alfiuria Quad PF) (Completed)       Meds ordered this encounter  Medications   amLODipine (NORVASC) 5 MG tablet    Sig: Take 1 tablet (5 mg total) by mouth daily.    Dispense:  90 tablet    Refill:  1   lisinopril (ZESTRIL) 5 MG tablet    Sig: Take 1 tablet (5 mg total) by mouth daily.    Dispense:  90 tablet    Refill:  1    Requesting 1 year supply   simvastatin (ZOCOR) 20 MG tablet    Sig: Take 1 tablet (20 mg total) by mouth every evening.    Dispense:  90 tablet    Refill:  1   triamterene-hydrochlorothiazide (DYAZIDE) 37.5-25 MG capsule    Sig: Take 1 each (1 capsule total) by mouth daily.    Dispense:  90 capsule    Refill:  1    Requesting 1 year supply    Follow-up:  Return in about 6 months (around 04/05/2022) for Follow up Chronic medical issues.  [place]

## 2021-10-06 NOTE — Assessment & Plan Note (Signed)
Advised compliance with therapy including aspirin therapy.

## 2021-10-07 LAB — CMP14+EGFR
ALT: 17 IU/L (ref 0–32)
AST: 21 IU/L (ref 0–40)
Albumin/Globulin Ratio: 1.6 (ref 1.2–2.2)
Albumin: 4.5 g/dL (ref 3.8–4.8)
Alkaline Phosphatase: 81 IU/L (ref 44–121)
BUN/Creatinine Ratio: 20 (ref 12–28)
BUN: 22 mg/dL (ref 8–27)
Bilirubin Total: 0.6 mg/dL (ref 0.0–1.2)
CO2: 24 mmol/L (ref 20–29)
Calcium: 10 mg/dL (ref 8.7–10.3)
Chloride: 104 mmol/L (ref 96–106)
Creatinine, Ser: 1.1 mg/dL — ABNORMAL HIGH (ref 0.57–1.00)
Globulin, Total: 2.8 g/dL (ref 1.5–4.5)
Glucose: 95 mg/dL (ref 70–99)
Potassium: 4.3 mmol/L (ref 3.5–5.2)
Sodium: 143 mmol/L (ref 134–144)
Total Protein: 7.3 g/dL (ref 6.0–8.5)
eGFR: 56 mL/min/{1.73_m2} — ABNORMAL LOW (ref >59)

## 2021-10-07 LAB — LIPID PANEL
Chol/HDL Ratio: 2.6 ratio (ref 0.0–4.4)
Cholesterol, Total: 267 mg/dL — ABNORMAL HIGH (ref 100–199)
HDL: 103 mg/dL (ref >39)
LDL Chol Calc (NIH): 155 mg/dL — ABNORMAL HIGH (ref 0–99)
Triglycerides: 61 mg/dL (ref 0–149)
VLDL Cholesterol Cal: 9 mg/dL (ref 5–40)

## 2021-10-12 ENCOUNTER — Other Ambulatory Visit: Payer: Self-pay | Admitting: Family Medicine

## 2021-10-12 MED ORDER — ROSUVASTATIN CALCIUM 10 MG PO TABS: ORAL_TABLET | ORAL | 0 refills | Status: DC

## 2021-11-24 ENCOUNTER — Other Ambulatory Visit: Payer: Self-pay | Admitting: Family Medicine

## 2021-11-24 DIAGNOSIS — I10 Essential (primary) hypertension: Secondary | ICD-10-CM

## 2022-02-01 ENCOUNTER — Other Ambulatory Visit (HOSPITAL_COMMUNITY): Payer: Self-pay | Admitting: Family Medicine

## 2022-02-01 DIAGNOSIS — Z1231 Encounter for screening mammogram for malignant neoplasm of breast: Secondary | ICD-10-CM

## 2022-02-11 ENCOUNTER — Other Ambulatory Visit: Payer: Self-pay

## 2022-02-11 ENCOUNTER — Ambulatory Visit (HOSPITAL_COMMUNITY)
Admission: RE | Admit: 2022-02-11 | Discharge: 2022-02-11 | Disposition: A | Discharge status: Home / Self Care | Payer: 59 | Source: Ambulatory Visit | Attending: Family Medicine | Admitting: Family Medicine

## 2022-02-11 DIAGNOSIS — Z1231 Encounter for screening mammogram for malignant neoplasm of breast: Secondary | ICD-10-CM | POA: Diagnosis not present

## 2022-04-05 ENCOUNTER — Ambulatory Visit: Payer: 59 | Admitting: Family Medicine

## 2022-04-08 ENCOUNTER — Ambulatory Visit (INDEPENDENT_AMBULATORY_CARE_PROVIDER_SITE_OTHER): Payer: 59 | Admitting: Family Medicine

## 2022-04-08 ENCOUNTER — Encounter: Payer: Self-pay | Admitting: Family Medicine

## 2022-04-08 VITALS — BP 130/72 | HR 60 | Temp 97.8°F | Wt 164.2 lb

## 2022-04-08 DIAGNOSIS — R21 Rash and other nonspecific skin eruption: Secondary | ICD-10-CM | POA: Insufficient documentation

## 2022-04-08 DIAGNOSIS — Z130 Encounter for screening for diseases of the blood and blood-forming organs and certain disorders involving the immune mechanism: Secondary | ICD-10-CM

## 2022-04-08 DIAGNOSIS — E785 Hyperlipidemia, unspecified: Secondary | ICD-10-CM | POA: Diagnosis not present

## 2022-04-08 DIAGNOSIS — I10 Essential (primary) hypertension: Secondary | ICD-10-CM

## 2022-04-08 MED ORDER — AMLODIPINE BESYLATE 5 MG PO TABS: 5.0000 mg | ORAL_TABLET | Freq: Every day | ORAL | 3 refills | Status: DC

## 2022-04-08 MED ORDER — TRIAMTERENE-HCTZ 37.5-25 MG PO CAPS: 1.0000 | ORAL_CAPSULE | Freq: Every day | ORAL | 3 refills | Status: DC

## 2022-04-08 MED ORDER — ROSUVASTATIN CALCIUM 10 MG PO TABS: ORAL_TABLET | ORAL | 3 refills | Status: DC

## 2022-04-08 MED ORDER — LISINOPRIL 5 MG PO TABS: 5.0000 mg | ORAL_TABLET | Freq: Every day | ORAL | 3 refills | Status: DC

## 2022-04-08 MED ORDER — TRIAMCINOLONE ACETONIDE 0.1 % EX CREA: 1.0000 "application " | TOPICAL_CREAM | Freq: Two times a day (BID) | CUTANEOUS | 0 refills | Status: DC

## 2022-04-08 NOTE — Progress Notes (Signed)
? ?Subjective:  ?Patient ID: Erin Foster, female    DOB: 1956/11/14  Age: 66 y.o. MRN: [id number] ? ?CC: ?Chief Complaint  ?Patient presents with  ? Hypertension  ? Hyperlipidemia  ?  Rash on neck area- noticed about 2 months off and on. Pt would like to switch back to Simvastatin.   ? ? ?HPI: ? ?66 year old female with a past medical history of stroke, hyperlipidemia, hypertension presents for follow-up. ? ?Hypertension is stable.  BP was elevated initially but on recheck was well controlled.  Remains on lisinopril, amlodipine, and triamterene HCTZ.  No reported side effects. ? ?Patient is on Crestor.  Needs better control of lipids.  Last LDL was 155.  Obtaining labs today. ? ?Patient reports ongoing rash around the neck for past few months. Dry, itching. No relieving factors. No known inciting factor.  ? ?Patient Active Problem List  ? Diagnosis Date Noted  ? Rash 04/08/2022  ? Essential hypertension, benign 07/24/2013  ? Hyperlipidemia LDL goal <70 07/24/2013  ? History of CVA (cerebrovascular accident) 07/24/2013  ? ? ?Social Hx   ?Social History  ? ?Socioeconomic History  ? Marital status: Single  ?  Spouse name: Not on file  ? Number of children: Not on file  ? Years of education: Not on file  ? Highest education level: Not on file  ?Occupational History  ? Not on file  ?Tobacco Use  ? Smoking status: Never  ? Smokeless tobacco: Never  ?Substance and Sexual Activity  ? Alcohol use: No  ?  Alcohol/week: 0.0 standard drinks  ? Drug use: No  ? Sexual activity: Yes  ?  Partners: Male  ?  Birth control/protection: None, Post-menopausal  ?Other Topics Concern  ? Not on file  ?Social History Narrative  ? Not on file  ? ?Social Determinants of Health  ? ?Financial Resource Strain: Not on file  ?Food Insecurity: Not on file  ?Transportation Needs: Not on file  ?Physical Activity: Not on file  ?Stress: Not on file  ?Social Connections: Not on file  ? ? ?Review of Systems ?Per HPI ? ?Objective:  ?BP 130/72    Pulse 60   Temp 97.8 ?F (36.6 ?C)   Wt 164 lb 3.2 oz (74.5 kg)   SpO2 100%   BMI 32.07 kg/m?  ? ? ?  04/08/2022  ? 10:58 AM 04/08/2022  ? 10:34 AM 10/06/2021  ? 10:09 AM  ?BP/Weight  ?Systolic BP 299 242 683  ?Diastolic BP 72 83 77  ?Wt. (Lbs)  164.2 164  ?BMI  32.07 kg/m2 32.03 kg/m2  ? ? ?Physical Exam ?Constitutional:   ?   General: She is not in acute distress. ?   Appearance: Normal appearance. She is not ill-appearing.  ?HENT:  ?   Head: Normocephalic and atraumatic.  ?Eyes:  ?   General:     ?   Right eye: No discharge.     ?   Left eye: No discharge.  ?   Conjunctiva/sclera: Conjunctivae normal.  ?Cardiovascular:  ?   Rate and Rhythm: Normal rate and regular rhythm.  ?Pulmonary:  ?   Effort: Pulmonary effort is normal.  ?   Breath sounds: Normal breath sounds. No wheezing, rhonchi or rales.  ?Skin: ?   Comments: Left side of the neck with slightly raised and dry rash.  ?Neurological:  ?   Mental Status: She is alert.  ?Psychiatric:     ?   Mood and Affect: Mood normal.     ?  Behavior: Behavior normal.  ? ? ?Lab Results  ?Component Value Date  ? WBC 5.1 01/26/2021  ? HGB 13.7 01/26/2021  ? HCT 40.9 01/26/2021  ? PLT 225 01/26/2021  ? GLUCOSE 95 10/06/2021  ? CHOL 267 (H) 10/06/2021  ? TRIG 61 10/06/2021  ? HDL 103 10/06/2021  ? [place] 155 (H) 10/06/2021  ? ALT 17 10/06/2021  ? AST 21 10/06/2021  ? NA 143 10/06/2021  ? K 4.3 10/06/2021  ? CL 104 10/06/2021  ? CREATININE 1.10 (H) 10/06/2021  ? BUN 22 10/06/2021  ? CO2 24 10/06/2021  ? TSH 1.750 01/26/2021  ? ? ? ?Assessment & Plan:  ? ?Problem List Items Addressed This Visit   ? ?  ? Cardiovascular and Mediastinum  ? Essential hypertension, benign  ?  At goal continue current medication.  Refilled today. ? ?  ?  ? Relevant Medications  ? triamterene-hydrochlorothiazide (DYAZIDE) 37.5-25 MG capsule  ? rosuvastatin (CRESTOR) 10 MG tablet  ? lisinopril (ZESTRIL) 5 MG tablet  ? amLODipine (NORVASC) 5 MG tablet  ? Other Relevant Orders  ? CMP14+EGFR  ?  ?  Musculoskeletal and Integument  ? Rash  ?  Treating with triamcinolone. ? ?  ?  ?  ? Other  ? Hyperlipidemia LDL goal <70 - Primary  ?  Lipid panel today for further vibration.  Continue Crestor.  May need dose increase pending lipid panel results. ? ?  ?  ? Relevant Medications  ? triamterene-hydrochlorothiazide (DYAZIDE) 37.5-25 MG capsule  ? rosuvastatin (CRESTOR) 10 MG tablet  ? lisinopril (ZESTRIL) 5 MG tablet  ? amLODipine (NORVASC) 5 MG tablet  ? Other Relevant Orders  ? Lipid panel  ? ?Other Visit Diagnoses   ? ? Screening for deficiency anemia      ? Relevant Orders  ? CBC  ? ?  ? ? ?Meds ordered this encounter  ?Medications  ? triamterene-hydrochlorothiazide (DYAZIDE) 37.5-25 MG capsule  ?  Sig: Take 1 each (1 capsule total) by mouth daily.  ?  Dispense:  90 capsule  ?  Refill:  3  ? rosuvastatin (CRESTOR) 10 MG tablet  ?  Sig: Take one tablet po daily  ?  Dispense:  90 tablet  ?  Refill:  3  ? lisinopril (ZESTRIL) 5 MG tablet  ?  Sig: Take 1 tablet (5 mg total) by mouth daily.  ?  Dispense:  90 tablet  ?  Refill:  3  ? amLODipine (NORVASC) 5 MG tablet  ?  Sig: Take 1 tablet (5 mg total) by mouth daily.  ?  Dispense:  90 tablet  ?  Refill:  3  ? triamcinolone cream (KENALOG) 0.1 %  ?  Sig: Apply 1 application. topically 2 (two) times daily.  ?  Dispense:  30 g  ?  Refill:  0  ? ? ?Follow-up:  Return in about 6 months (around 10/09/2022). ? ?Thersa Salt DO ?[facility] ? ?

## 2022-04-08 NOTE — Assessment & Plan Note (Signed)
At goal continue current medication.  Refilled today. ?

## 2022-04-08 NOTE — Assessment & Plan Note (Signed)
Lipid panel today for further vibration.  Continue Crestor.  May need dose increase pending lipid panel results. ?

## 2022-04-08 NOTE — Patient Instructions (Signed)
Labs today. ? ?Check BP regularly. ? ?I have refilled your medications. ? ?Follow up in 6 months. ? ?Take care ? ?Dr. Lacinda Axon  ?

## 2022-04-08 NOTE — Assessment & Plan Note (Signed)
Treating with triamcinolone. 

## 2022-04-09 LAB — CBC
Hematocrit: 39.4 % (ref 34.0–46.6)
Hemoglobin: 13.2 g/dL (ref 11.1–15.9)
MCH: 26.8 pg (ref 26.6–33.0)
MCHC: 33.5 g/dL (ref 31.5–35.7)
MCV: 80 fL (ref 79–97)
Platelets: 233 10*3/uL (ref 150–450)
RBC: 4.92 x10E6/uL (ref 3.77–5.28)
RDW: 13.7 % (ref 11.7–15.4)
WBC: 5.7 10*3/uL (ref 3.4–10.8)

## 2022-04-09 LAB — CMP14+EGFR
ALT: 27 IU/L (ref 0–32)
AST: 29 IU/L (ref 0–40)
Albumin/Globulin Ratio: 1.5 (ref 1.2–2.2)
Albumin: 4.4 g/dL (ref 3.8–4.8)
Alkaline Phosphatase: 87 IU/L (ref 44–121)
BUN/Creatinine Ratio: 18 (ref 12–28)
BUN: 19 mg/dL (ref 8–27)
Bilirubin Total: 0.4 mg/dL (ref 0.0–1.2)
CO2: 25 mmol/L (ref 20–29)
Calcium: 9.9 mg/dL (ref 8.7–10.3)
Chloride: 99 mmol/L (ref 96–106)
Creatinine, Ser: 1.04 mg/dL — ABNORMAL HIGH (ref 0.57–1.00)
Globulin, Total: 2.9 g/dL (ref 1.5–4.5)
Glucose: 93 mg/dL (ref 70–99)
Potassium: 4 mmol/L (ref 3.5–5.2)
Sodium: 136 mmol/L (ref 134–144)
Total Protein: 7.3 g/dL (ref 6.0–8.5)
eGFR: 60 mL/min/{1.73_m2} (ref >59)

## 2022-04-09 LAB — LIPID PANEL
Chol/HDL Ratio: 2.6 ratio (ref 0.0–4.4)
Cholesterol, Total: 253 mg/dL — ABNORMAL HIGH (ref 100–199)
HDL: 99 mg/dL (ref >39)
LDL Chol Calc (NIH): 144 mg/dL — ABNORMAL HIGH (ref 0–99)
Triglycerides: 61 mg/dL (ref 0–149)
VLDL Cholesterol Cal: 10 mg/dL (ref 5–40)

## 2022-07-02 ENCOUNTER — Other Ambulatory Visit: Payer: Self-pay

## 2022-07-02 ENCOUNTER — Encounter: Payer: Self-pay | Admitting: Emergency Medicine

## 2022-07-02 ENCOUNTER — Ambulatory Visit
Admission: EM | Admit: 2022-07-02 | Discharge: 2022-07-02 | Disposition: A | Discharge status: Home / Self Care | Payer: 59 | Attending: Nurse Practitioner | Admitting: Nurse Practitioner

## 2022-07-02 DIAGNOSIS — J069 Acute upper respiratory infection, unspecified: Secondary | ICD-10-CM | POA: Diagnosis not present

## 2022-07-02 DIAGNOSIS — R0989 Other specified symptoms and signs involving the circulatory and respiratory systems: Secondary | ICD-10-CM

## 2022-07-02 DIAGNOSIS — M25461 Effusion, right knee: Secondary | ICD-10-CM | POA: Diagnosis not present

## 2022-07-02 MED ORDER — DICLOFENAC SODIUM 1 % EX GEL: 2.0000 g | Freq: Four times a day (QID) | CUTANEOUS | 0 refills | Status: DC | PRN

## 2022-07-02 NOTE — ED Provider Notes (Signed)
[facility]    CSN: [id number] Arrival date & time: 07/02/22  1556      History   Chief Complaint Chief Complaint  Patient presents with   Nasal Congestion    HPI Erin Foster is a 66 y.o. female.   Patient presents with nasal congestion, slight dry cough, irritated throat, diarrhea, and nausea that began earlier this week. She vomited a couple of times, however has not at all today.  Reports her symptoms are most of the way improved now.  She denies chest pain, shortness of breath, abdominal pain, ear pain or drainage, decreased appetite, fatigue, new rash.  Also denies any blood in her stool or hematemesis.  She has not been taking anything for her symptoms.  Reports she is going to be around family in a couple of days and wants to make sure she does not have anything contagious.  Also reports she recently noticed her right knee is "fat."  She denies any recent accident, fall, injury, or trauma to the knee.  Denies pain, difficulty walking or weight bearing, weakness, popping/locking, and sensation of giving way.  Denies redness, bruising, decreased range of motion.  Reports the swelling is only noticeable when she stands.      Past Medical History:  Diagnosis Date   Asthma    CVA (cerebral vascular accident) (HCC)    Hyperlipidemia    Hypertension     Patient Active Problem List   Diagnosis Date Noted   Rash 04/08/2022   Essential hypertension, benign 07/24/2013   Hyperlipidemia LDL goal <70 07/24/2013   History of CVA (cerebrovascular accident) 07/24/2013    Past Surgical History:  Procedure Laterality Date   COLONOSCOPY      OB History   No obstetric history on file.      Home Medications    Prior to Admission medications   Medication Sig Start Date End Date Taking? Authorizing Provider  diclofenac Sodium (VOLTAREN) 1 % GEL Apply 2 g topically 4 (four) times daily as needed (pain). 07/02/22  Yes Cathlean Marseilles A, NP  amLODipine (NORVASC)  5 MG tablet Take 1 tablet (5 mg total) by mouth daily. 04/08/22   Tommie Sams, DO  aspirin 81 MG tablet Take 81 mg by mouth daily.    [provider]  lisinopril (ZESTRIL) 5 MG tablet Take 1 tablet (5 mg total) by mouth daily. 04/08/22   Tommie Sams, DO  rosuvastatin (CRESTOR) 10 MG tablet Take one tablet po daily 04/08/22   Everlene Other G, DO  triamcinolone cream (KENALOG) 0.1 % Apply 1 application. topically 2 (two) times daily. 04/08/22   Tommie Sams, DO  triamterene-hydrochlorothiazide (DYAZIDE) 37.5-25 MG capsule Take 1 each (1 capsule total) by mouth daily. 04/08/22   Tommie Sams, DO    Family History Family History  Problem Relation Age of Onset   Diabetes Mother    Hypertension Mother    Hyperlipidemia Mother    Cancer Maternal Aunt 23       breast   Hypertension Sister    Hypertension Sister     Social History Social History   Tobacco Use   Smoking status: Never   Smokeless tobacco: Never  Substance Use Topics   Alcohol use: No    Alcohol/week: 0.0 standard drinks of alcohol   Drug use: No     Allergies   Patient has no known allergies.   Review of Systems Review of Systems Per HPI  Physical Exam Triage Vital  Signs ED Triage Vitals [07/02/22 1607]  Enc Vitals Group     BP (!) 144/82     Pulse Rate 69     Resp 20     Temp 98.1 F (36.7 C)     Temp Source Oral     SpO2 99 %     Weight      Height      Head Circumference      Peak Flow      Pain Score 0     Pain Loc      Pain Edu?      Excl. in GC?    No data found.  Updated Vital Signs BP (!) 144/82 (BP Location: Right Arm)   Pulse 69   Temp 98.1 F (36.7 C) (Oral)   Resp 20   SpO2 99%   Visual Acuity Right Eye Distance:   Left Eye Distance:   Bilateral Distance:    Right Eye Near:   Left Eye Near:    Bilateral Near:     Physical Exam Vitals and nursing note reviewed.  Constitutional:      General: She is not in acute distress.    Appearance: She is not  toxic-appearing.  HENT:     Head: Normocephalic and atraumatic.     Right Ear: Tympanic membrane, ear canal and external ear normal. There is no impacted cerumen.     Left Ear: Tympanic membrane, ear canal and external ear normal. There is no impacted cerumen.     Nose: Nose normal. No congestion or rhinorrhea.     Mouth/Throat:     Mouth: Mucous membranes are moist.     Pharynx: Oropharynx is clear. No posterior oropharyngeal erythema.  Eyes:     General: No scleral icterus.    Extraocular Movements: Extraocular movements intact.  Cardiovascular:     Rate and Rhythm: Normal rate and regular rhythm.  Pulmonary:     Effort: Pulmonary effort is normal. No respiratory distress.     Breath sounds: Normal breath sounds. No wheezing, rhonchi or rales.  Abdominal:     General: Abdomen is flat. Bowel sounds are normal. There is no distension.     Palpations: Abdomen is soft.     Tenderness: There is no abdominal tenderness. There is no guarding.  Musculoskeletal:     Cervical back: Normal range of motion.     Right knee: Normal.     Left knee: Normal.     Right lower leg: Normal.     Left lower leg: Normal.     Right ankle: Normal.     Left ankle: Normal.     Right foot: Normal.     Left foot: Normal.       Legs:     Comments: Inspection: No swelling, obvious deformity, or redness.  Patient gestures to the area marked as the area of swelling  Palpation: Bilateral lower extremities nontender to palpation including bilateral knees.  No obvious deformities palpated. ROM: Full ROM bilateral lower extremities Strength: 5/5 bilateral lower extremities Neurovascular: neurovascularly intact in left and right lower extremity   Lymphadenopathy:     Cervical: No cervical adenopathy.  Skin:    General: Skin is warm and dry.     Coloration: Skin is not jaundiced or pale.     Findings: No erythema.  Neurological:     Mental Status: She is alert and oriented to person, place, and time.   Psychiatric:  Behavior: Behavior is cooperative.      UC Treatments / Results  Labs (all labs ordered are listed, but only abnormal results are displayed) Labs Reviewed  COVID-19, FLU A+B NAA    EKG   Radiology No results found.  Procedures Procedures (including critical care time)  Medications Ordered in UC Medications - No data to display  Initial Impression / Assessment and Plan / UC Course  I have reviewed the triage vital signs and the nursing notes.  Pertinent labs & imaging results that were available during my care of the patient were reviewed by me and considered in my medical decision making (see chart for details).    Patient is a very pleasant, well-appearing 66 year old female presenting for nasal congestion.  Suspect symptoms are viral in etiology.  COVID-19, influenza testing obtained.  Discussed supportive care to help with symptoms, although it sounds like most of her symptoms have resolved.  Instructed her to seek care if symptoms persist or worsen.  ER precautions discussed.  Regarding right knee swelling, I do not appreciate any swelling on exam today.  Initially, there is no pain, redness, warmth, fevers, or stiffness with range of motion.  Recommended ice, compression sleeve to help with swelling.  If pain develops, she can use Voltaren gel topically.  Discussed follow-up with PCP if symptoms persist or worsen despite treatment.  Final Clinical Impressions(s) / UC Diagnoses   Final diagnoses:  Runny nose  Viral URI  Swelling of joint of right knee     Discharge Instructions      - The symptoms that began earlier this week are likely viral in etiology and will improve over the next few days.  Continue pushing hydration with plenty of water, using Mucinex as needed for congestion, saline sinus rinses for congestion and salt water rinses for congestion.  We have tested you for COVID-19 and influenza and we will call you Monday if these results  are positive. -It sounds like the GI symptoms have resolved -The swelling in her right knee may be due to arthritis.  You can use ice, a compression sleeve, or diclofenac gel to help with any pain.  Follow-up with primary care if you develop pain in the knee.    ED Prescriptions     Medication Sig Dispense Auth. Provider   diclofenac Sodium (VOLTAREN) 1 % GEL Apply 2 g topically 4 (four) times daily as needed (pain). 2 g Valentino Nose, NP      PDMP not reviewed this encounter.   Valentino Nose, NP 07/02/22 [id number]

## 2022-07-02 NOTE — ED Triage Notes (Signed)
Pt reports nasal congestion, diarrhea, emesis for last several days. Pt reports " I am going to be around a lot of family members soon and I want to make sure I don't have anything."   Pt also reports right knee swelling. No known injury. Gait steady.

## 2022-07-02 NOTE — Discharge Instructions (Addendum)
-   The symptoms that began earlier this week are likely viral in etiology and will improve over the next few days.  Continue pushing hydration with plenty of water, using Mucinex as needed for congestion, saline sinus rinses for congestion and salt water rinses for congestion.  We have tested you for COVID-19 and influenza and we will call you Monday if these results are positive. -It sounds like the GI symptoms have resolved -The swelling in her right knee may be due to arthritis.  You can use ice, a compression sleeve, or diclofenac gel to help with any pain.  Follow-up with primary care if you develop pain in the knee.

## 2022-07-05 LAB — COVID-19, FLU A+B NAA
Influenza A, NAA: NOT DETECTED
Influenza B, NAA: NOT DETECTED
SARS-CoV-2, NAA: NOT DETECTED

## 2022-07-09 ENCOUNTER — Ambulatory Visit (INDEPENDENT_AMBULATORY_CARE_PROVIDER_SITE_OTHER): Payer: 59 | Admitting: Family Medicine

## 2022-07-09 ENCOUNTER — Encounter: Payer: Self-pay | Admitting: Family Medicine

## 2022-07-09 VITALS — BP 125/74 | HR 62 | Temp 97.2°F | Wt 164.0 lb

## 2022-07-09 DIAGNOSIS — Z1211 Encounter for screening for malignant neoplasm of colon: Secondary | ICD-10-CM

## 2022-07-09 DIAGNOSIS — I10 Essential (primary) hypertension: Secondary | ICD-10-CM | POA: Diagnosis not present

## 2022-07-09 DIAGNOSIS — E785 Hyperlipidemia, unspecified: Secondary | ICD-10-CM

## 2022-07-09 MED ORDER — SIMVASTATIN 20 MG PO TABS: 20.0000 mg | ORAL_TABLET | Freq: Every day | ORAL | 3 refills | Status: DC

## 2022-07-09 NOTE — Assessment & Plan Note (Signed)
Uncontrolled.  Not at goal.  Restarting simvastatin as patient tolerated this in the past.

## 2022-07-09 NOTE — Assessment & Plan Note (Signed)
Stable.  Continue current medications.

## 2022-07-09 NOTE — Patient Instructions (Addendum)
Medications as directed.  I placed the referral to GI.  Follow up in 6 months.  Take care  Dr. Adriana Simas

## 2022-07-09 NOTE — Progress Notes (Signed)
Subjective:  Patient ID: Erin Foster, female    DOB: 07/16/1956  Age: 66 y.o. MRN: [id number]  CC: Chief Complaint  Patient presents with   Hyperlipidemia    Pt states Crestor was making her sure so she has not been taking it. Would like to go back on Simvastatin.    Hypertension    Blood pressure was elevated at Urgent Care on 06/30/22. No issues; taking meds as directed.     HPI:  66 year old female with hypertension, hyperlipidemia, history of CVA presents for follow-up.  Hypertension is at goal.  She is compliant with amlodipine, lisinopril, triamterene-HCTZ.  Patient is no longer on statin for hyperlipidemia due to reported cramps.  She was previously on simvastatin.  This was changed to Crestor.   Patient states that she is suffering from some sinus drainage which she has had for the past 2 weeks.  Otherwise she is doing well.  Patient Active Problem List   Diagnosis Date Noted   Essential hypertension, benign 07/24/2013   Hyperlipidemia LDL goal <70 07/24/2013   History of CVA (cerebrovascular accident) 07/24/2013    Social Hx   Social History   Socioeconomic History   Marital status: Single    Spouse name: Not on file   Number of children: Not on file   Years of education: Not on file   Highest education level: Not on file  Occupational History   Not on file  Tobacco Use   Smoking status: Never   Smokeless tobacco: Never  Substance and Sexual Activity   Alcohol use: No    Alcohol/week: 0.0 standard drinks of alcohol   Drug use: No   Sexual activity: Yes    Partners: Male    Birth control/protection: None, Post-menopausal  Other Topics Concern   Not on file  Social History Narrative   Not on file   Social Determinants of Health   Financial Resource Strain: Not on file  Food Insecurity: Not on file  Transportation Needs: Not on file  Physical Activity: Not on file  Stress: Not on file  Social Connections: Not on file    Review of  Systems Per HPI  Objective:  BP 125/74   Pulse 62   Temp (!) 97.2 F (36.2 C)   Wt 164 lb (74.4 kg)   SpO2 100%   BMI 32.03 kg/m      07/09/2022   10:26 AM 07/02/2022    4:07 PM 04/08/2022   10:58 AM  BP/Weight  Systolic BP 125 144 130  Diastolic BP 74 82 72  Wt. (Lbs) 164    BMI 32.03 kg/m2      Physical Exam Vitals and nursing note reviewed.  Constitutional:      General: She is not in acute distress.    Appearance: Normal appearance.  HENT:     Head: Normocephalic and atraumatic.     Mouth/Throat:     Pharynx: Oropharynx is clear.  Eyes:     General:        Right eye: No discharge.        Left eye: No discharge.     Conjunctiva/sclera: Conjunctivae normal.  Cardiovascular:     Rate and Rhythm: Normal rate and regular rhythm.  Pulmonary:     Effort: Pulmonary effort is normal.     Breath sounds: Normal breath sounds. No wheezing, rhonchi or rales.  Neurological:     Mental Status: She is alert.     Lab Results  Component Value Date  WBC 5.7 04/08/2022   HGB 13.2 04/08/2022   HCT 39.4 04/08/2022   PLT 233 04/08/2022   GLUCOSE 93 04/08/2022   CHOL 253 (H) 04/08/2022   TRIG 61 04/08/2022   HDL 99 04/08/2022   LDLCALC 144 (H) 04/08/2022   ALT 27 04/08/2022   AST 29 04/08/2022   NA 136 04/08/2022   K 4.0 04/08/2022   CL 99 04/08/2022   CREATININE 1.04 (H) 04/08/2022   BUN 19 04/08/2022   CO2 25 04/08/2022   TSH 1.750 01/26/2021     Assessment & Plan:   Problem List Items Addressed This Visit       Cardiovascular and Mediastinum   Essential hypertension, benign - Primary    Stable.  Continue current medications.      Relevant Medications   simvastatin (ZOCOR) 20 MG tablet     Other   Hyperlipidemia LDL goal <70    Uncontrolled.  Not at goal.  Restarting simvastatin as patient tolerated this in the past.      Relevant Medications   simvastatin (ZOCOR) 20 MG tablet   Other Visit Diagnoses     Encounter for screening colonoscopy        Relevant Orders   Ambulatory referral to Gastroenterology       Meds ordered this encounter  Medications   simvastatin (ZOCOR) 20 MG tablet    Sig: Take 1 tablet (20 mg total) by mouth at bedtime.    Dispense:  90 tablet    Refill:  3    Follow-up:  Return in about 6 months (around 01/09/2023).  Everlene Other DO [facility]

## 2022-07-13 ENCOUNTER — Encounter (INDEPENDENT_AMBULATORY_CARE_PROVIDER_SITE_OTHER): Payer: Self-pay | Admitting: *Deleted

## 2022-07-16 ENCOUNTER — Telehealth: Payer: Self-pay

## 2022-07-16 ENCOUNTER — Other Ambulatory Visit: Payer: Self-pay

## 2022-07-16 DIAGNOSIS — Z1211 Encounter for screening for malignant neoplasm of colon: Secondary | ICD-10-CM

## 2022-07-16 NOTE — Telephone Encounter (Signed)
Patient informed per referral orders placed to covered gastroenterology.

## 2022-07-16 NOTE — Telephone Encounter (Signed)
Caller name:Natilee Lazare   On DPR? :No  Call back number:[phone]  Provider they see: Adriana Simas   Reason for call:Pt was referred for colonoscopy to [facility] they are out of network and she has to be referred to [facility] [address] Ginette Otto is what is in her network and she needs to be referred there.

## 2022-10-19 ENCOUNTER — Ambulatory Visit (INDEPENDENT_AMBULATORY_CARE_PROVIDER_SITE_OTHER): Payer: 59 | Admitting: Family Medicine

## 2022-10-19 VITALS — BP 130/82 | HR 71 | Temp 97.5°F | Ht 60.0 in | Wt 168.4 lb

## 2022-10-19 DIAGNOSIS — R220 Localized swelling, mass and lump, head: Secondary | ICD-10-CM | POA: Insufficient documentation

## 2022-10-19 MED ORDER — AMOXICILLIN-POT CLAVULANATE 875-125 MG PO TABS: 1.0000 | ORAL_TABLET | Freq: Two times a day (BID) | ORAL | 0 refills | Status: DC

## 2022-10-19 NOTE — Progress Notes (Signed)
Subjective:  Patient ID: Erin Foster, female    DOB: 1956/06/03  Age: 66 y.o. MRN: [id number]  CC: Chief Complaint  Patient presents with   Ear Pain    HPI:  66 year old female presents for evaluation of the above.  Patient reports a 2-day history of symptoms.  She has had left ear pain.  Has also had some recent swelling of the left jaw.  She has had some mild discomfort in her throat.  No sinus pressure or pain.  No congestion.  No fever.  Denies dental pain.  No relieving factors.  No other complaints.  Patient Active Problem List   Diagnosis Date Noted   Jaw swelling 10/19/2022   Essential hypertension, benign 07/24/2013   Hyperlipidemia LDL goal <70 07/24/2013   History of CVA (cerebrovascular accident) 07/24/2013    Social Hx   Social History   Socioeconomic History   Marital status: Single    Spouse name: Not on file   Number of children: Not on file   Years of education: Not on file   Highest education level: Not on file  Occupational History   Not on file  Tobacco Use   Smoking status: Never   Smokeless tobacco: Never  Substance and Sexual Activity   Alcohol use: No    Alcohol/week: 0.0 standard drinks of alcohol   Drug use: No   Sexual activity: Yes    Partners: Male    Birth control/protection: None, Post-menopausal  Other Topics Concern   Not on file  Social History Narrative   Not on file   Social Determinants of Health   Financial Resource Strain: Not on file  Food Insecurity: Not on file  Transportation Needs: Not on file  Physical Activity: Not on file  Stress: Not on file  Social Connections: Not on file    Review of Systems Per HPI  Objective:  BP 130/82   Pulse 71   Temp (!) 97.5 F (36.4 C) (Oral)   Ht 5' (1.524 m)   Wt 168 lb 6.4 oz (76.4 kg)   SpO2 95%   BMI 32.89 kg/m      10/19/2022    4:44 PM 07/09/2022   10:26 AM 07/02/2022    4:07 PM  BP/Weight  Systolic BP 130 125 144  Diastolic BP 82 74 82  Wt. (Lbs)  168.4 164   BMI 32.89 kg/m2 32.03 kg/m2     Physical Exam Vitals and nursing note reviewed.  Constitutional:      General: She is not in acute distress.    Appearance: Normal appearance.  HENT:     Head: Normocephalic and atraumatic.     Right Ear: Tympanic membrane normal.     Left Ear: Tympanic membrane normal.     Mouth/Throat:     Pharynx: Oropharynx is clear.  Cardiovascular:     Rate and Rhythm: Normal rate and regular rhythm.  Pulmonary:     Effort: Pulmonary effort is normal.     Breath sounds: Normal breath sounds. No wheezing, rhonchi or rales.  Musculoskeletal:     Cervical back: Neck supple.  Lymphadenopathy:     Cervical: No cervical adenopathy.  Neurological:     Mental Status: She is alert.     Lab Results  Component Value Date   WBC 5.7 04/08/2022   HGB 13.2 04/08/2022   HCT 39.4 04/08/2022   PLT 233 04/08/2022   GLUCOSE 93 04/08/2022   CHOL 253 (H) 04/08/2022   TRIG 61 04/08/2022  HDL 99 04/08/2022   LDLCALC 144 (H) 04/08/2022   ALT 27 04/08/2022   AST 29 04/08/2022   NA 136 04/08/2022   K 4.0 04/08/2022   CL 99 04/08/2022   CREATININE 1.04 (H) 04/08/2022   BUN 19 04/08/2022   CO2 25 04/08/2022   TSH 1.750 01/26/2021     Assessment & Plan:   Problem List Items Addressed This Visit       Other   Jaw swelling - Primary    No evidence of otitis media on exam. Patient reports recent swelling around the left jaw.  Concern for early dental infection.  Covering with Augmentin.  If fails to improve or worsens, she will follow-up.       Meds ordered this encounter  Medications   amoxicillin-clavulanate (AUGMENTIN) 875-125 MG tablet    Sig: Take 1 tablet by mouth 2 (two) times daily.    Dispense:  14 tablet    Refill:  0    Follow-up:  Return if symptoms worsen or fail to improve.  Everlene Other DO [facility]

## 2022-10-19 NOTE — Assessment & Plan Note (Signed)
No evidence of otitis media on exam. Patient reports recent swelling around the left jaw.  Concern for early dental infection.  Covering with Augmentin.  If fails to improve or worsens, she will follow-up.

## 2022-10-19 NOTE — Patient Instructions (Signed)
Antibiotic to cover for possible dental infection.  Call if symptoms do not improve.  Take care  Dr. Adriana Simas

## 2023-01-10 ENCOUNTER — Ambulatory Visit (INDEPENDENT_AMBULATORY_CARE_PROVIDER_SITE_OTHER): Payer: Medicare Other | Admitting: Family Medicine

## 2023-01-10 DIAGNOSIS — Z130 Encounter for screening for diseases of the blood and blood-forming organs and certain disorders involving the immune mechanism: Secondary | ICD-10-CM

## 2023-01-10 DIAGNOSIS — I10 Essential (primary) hypertension: Secondary | ICD-10-CM | POA: Diagnosis not present

## 2023-01-10 DIAGNOSIS — E785 Hyperlipidemia, unspecified: Secondary | ICD-10-CM | POA: Diagnosis not present

## 2023-01-10 DIAGNOSIS — Z0000 Encounter for general adult medical examination without abnormal findings: Secondary | ICD-10-CM | POA: Insufficient documentation

## 2023-01-10 DIAGNOSIS — Z780 Asymptomatic menopausal state: Secondary | ICD-10-CM

## 2023-01-10 NOTE — Assessment & Plan Note (Signed)
Reassessing today with panel.  Has not been at goal.  Will likely need change in statin therapy.  I have tried this previously without success.

## 2023-01-10 NOTE — Patient Instructions (Signed)
Labs ordered.   Continue your medications.  Follow up in 6 months.

## 2023-01-10 NOTE — Progress Notes (Signed)
Subjective:  Patient ID: Erin Foster, female    DOB: Oct 29, 1956  Age: 67 y.o. MRN: [id number]  CC: Chief Complaint  Patient presents with   Hypertension    HPI:  67 year old female with a history of CVA, hypertension, and hyperlipidemia presents for follow-up.  Hypertension is well-controlled on amlodipine 5 mg daily, lisinopril 5 mg daily, and triamterene/HCTZ 37.5-25 mg daily.  Patient's lipids have not been at goal.  She is currently on simvastatin.  Needs lipid panel.  Goal is less than 70 given prior CVA.  In regards to her preventative health care, she has an upcoming colonoscopy scheduled.  Patient declines pneumonia vaccine.  She is amenable to DEXA scan.  Patient Active Problem List   Diagnosis Date Noted   Preventative health care 01/10/2023   Essential hypertension, benign 07/24/2013   Hyperlipidemia LDL goal <70 07/24/2013   History of CVA (cerebrovascular accident) 07/24/2013    Social Hx   Social History   Socioeconomic History   Marital status: Single    Spouse name: Not on file   Number of children: Not on file   Years of education: Not on file   Highest education level: Not on file  Occupational History   Not on file  Tobacco Use   Smoking status: Never   Smokeless tobacco: Never  Substance and Sexual Activity   Alcohol use: No    Alcohol/week: 0.0 standard drinks of alcohol   Drug use: No   Sexual activity: Yes    Partners: Male    Birth control/protection: None, Post-menopausal  Other Topics Concern   Not on file  Social History Narrative   Not on file   Social Determinants of Health   Financial Resource Strain: Not on file  Food Insecurity: Not on file  Transportation Needs: Not on file  Physical Activity: Not on file  Stress: Not on file  Social Connections: Not on file    Review of Systems  Constitutional: Negative.   Respiratory: Negative.    Cardiovascular: Negative.     Objective:  BP (!) 109/56   Pulse (!) 58    Temp (!) 97 F (36.1 C)   Ht 5' (1.524 m)   Wt 167 lb (75.8 kg)   SpO2 100%   BMI 32.61 kg/m      01/10/2023   10:15 AM 10/19/2022    4:44 PM 07/09/2022   10:26 AM  BP/Weight  Systolic BP 0000000 [id number]  Diastolic BP 56 82 74  Wt. (Lbs) 167 168.4 164  BMI 32.61 kg/m2 32.89 kg/m2 32.03 kg/m2    Physical Exam Vitals and nursing note reviewed.  Constitutional:      General: She is not in acute distress.    Appearance: Normal appearance.  HENT:     Head: Normocephalic and atraumatic.  Eyes:     General:        Right eye: No discharge.        Left eye: No discharge.     Conjunctiva/sclera: Conjunctivae normal.  Cardiovascular:     Rate and Rhythm: Normal rate and regular rhythm.     Heart sounds: Murmur heard.  Pulmonary:     Effort: Pulmonary effort is normal.     Breath sounds: Normal breath sounds. No wheezing, rhonchi or rales.  Neurological:     Mental Status: She is alert.  Psychiatric:        Mood and Affect: Mood normal.        Behavior: Behavior normal.  Lab Results  Component Value Date   WBC 5.7 04/08/2022   HGB 13.2 04/08/2022   HCT 39.4 04/08/2022   PLT 233 04/08/2022   GLUCOSE 93 04/08/2022   CHOL 253 (H) 04/08/2022   TRIG 61 04/08/2022   HDL 99 04/08/2022   LDLCALC 144 (H) 04/08/2022   ALT 27 04/08/2022   AST 29 04/08/2022   NA 136 04/08/2022   K 4.0 04/08/2022   CL 99 04/08/2022   CREATININE 1.04 (H) 04/08/2022   BUN 19 04/08/2022   CO2 25 04/08/2022   TSH 1.750 01/26/2021     Assessment & Plan:   Problem List Items Addressed This Visit       Cardiovascular and Mediastinum   Essential hypertension, benign    Well-controlled.  Continue current medications.  Labs today.      Relevant Orders   CMP14+EGFR     Other   Hyperlipidemia LDL goal <70    Reassessing today with panel.  Has not been at goal.  Will likely need change in statin therapy.  I have tried this previously without success.      Relevant Orders   Lipid panel    Preventative health care    Order placed for DEXA scan.  Patient declines pneumonia vaccine.  Has upcoming colonoscopy scheduled.      Other Visit Diagnoses     Screening for deficiency anemia    -  Primary   Relevant Orders   CBC   Postmenopausal       Relevant Orders   DG Bone Density       Follow-up:  Return in about 6 months (around 07/11/2023).  [place]

## 2023-01-10 NOTE — Assessment & Plan Note (Signed)
Order placed for DEXA scan.  Patient declines pneumonia vaccine.  Has upcoming colonoscopy scheduled.

## 2023-01-10 NOTE — Assessment & Plan Note (Signed)
Well-controlled.  Continue current medications.  Labs today.

## 2023-01-18 ENCOUNTER — Other Ambulatory Visit (HOSPITAL_COMMUNITY): Payer: Self-pay | Admitting: Family Medicine

## 2023-01-18 DIAGNOSIS — Z1231 Encounter for screening mammogram for malignant neoplasm of breast: Secondary | ICD-10-CM

## 2023-01-24 DIAGNOSIS — Z1211 Encounter for screening for malignant neoplasm of colon: Secondary | ICD-10-CM | POA: Diagnosis not present

## 2023-02-14 ENCOUNTER — Ambulatory Visit (HOSPITAL_COMMUNITY)
Admission: RE | Admit: 2023-02-14 | Discharge: 2023-02-14 | Disposition: A | Discharge status: Home / Self Care | Payer: Medicare Other | Source: Ambulatory Visit | Attending: Family Medicine | Admitting: Family Medicine

## 2023-02-14 DIAGNOSIS — Z780 Asymptomatic menopausal state: Secondary | ICD-10-CM | POA: Insufficient documentation

## 2023-02-14 DIAGNOSIS — Z1231 Encounter for screening mammogram for malignant neoplasm of breast: Secondary | ICD-10-CM | POA: Diagnosis not present

## 2023-02-14 DIAGNOSIS — M8589 Other specified disorders of bone density and structure, multiple sites: Secondary | ICD-10-CM | POA: Diagnosis not present

## 2023-02-16 ENCOUNTER — Encounter: Payer: Self-pay | Admitting: Family Medicine

## 2023-02-16 ENCOUNTER — Ambulatory Visit (INDEPENDENT_AMBULATORY_CARE_PROVIDER_SITE_OTHER): Payer: Medicare Other | Admitting: Family Medicine

## 2023-02-16 VITALS — BP 123/70 | HR 50 | Temp 98.6°F | Ht 60.0 in | Wt 163.0 lb

## 2023-02-16 DIAGNOSIS — E049 Nontoxic goiter, unspecified: Secondary | ICD-10-CM | POA: Insufficient documentation

## 2023-02-16 DIAGNOSIS — E785 Hyperlipidemia, unspecified: Secondary | ICD-10-CM

## 2023-02-16 DIAGNOSIS — I10 Essential (primary) hypertension: Secondary | ICD-10-CM

## 2023-02-16 NOTE — Patient Instructions (Addendum)
Labs today.  We will set up the Ultrasound.  We will call with results.  Follow up in August as scheduled (8/12 @ 10 am).

## 2023-02-16 NOTE — Assessment & Plan Note (Signed)
Ultrasound as well as TSH, free T4, and free T3 for evaluation.

## 2023-02-16 NOTE — Progress Notes (Signed)
Subjective:  Patient ID: Erin Foster, female    DOB: 1956/09/03  Age: 67 y.o. MRN: [id number]  CC: Chief Complaint  Patient presents with   right side neck swelling     Family hx of hypothyroidism , wants to be checked for thyroid    HPI:  67 year old female presents for evaluation of the above.  Patient reports that over the past week she has noticed swelling of the anterior neck which she believes is from her thyroid.  She has a family history of thyroid disease.  She states that is not painful.  She is otherwise feeling well.  No reported fatigue or other symptoms of hypo or hyperthyroidism.  Hypertension is stable on amlodipine, lisinopril, and triamterene/HCTZ.  Patient Active Problem List   Diagnosis Date Noted   Goiter 02/16/2023   Preventative health care 01/10/2023   Essential hypertension, benign 07/24/2013   Hyperlipidemia LDL goal <70 07/24/2013   History of CVA (cerebrovascular accident) 07/24/2013    Social Hx   Social History   Socioeconomic History   Marital status: Married    Spouse name: Not on file   Number of children: Not on file   Years of education: Not on file   Highest education level: Not on file  Occupational History   Not on file  Tobacco Use   Smoking status: Never   Smokeless tobacco: Never  Substance and Sexual Activity   Alcohol use: No    Alcohol/week: 0.0 standard drinks of alcohol   Drug use: No   Sexual activity: Yes    Partners: Male    Birth control/protection: None, Post-menopausal  Other Topics Concern   Not on file  Social History Narrative   Not on file   Social Determinants of Health   Financial Resource Strain: Not on file  Food Insecurity: Not on file  Transportation Needs: Not on file  Physical Activity: Not on file  Stress: Not on file  Social Connections: Not on file    Review of Systems Per HPI  Objective:  BP 123/70   Pulse (!) 50   Temp 98.6 F (37 C)   Ht 5' (1.524 m)   Wt 163 lb (73.9  kg)   SpO2 100%   BMI 31.83 kg/m      02/16/2023   11:13 AM 01/10/2023   10:15 AM 10/19/2022    4:44 PM  BP/Weight  Systolic BP [id number] 0000000 [id number]  Diastolic BP 70 56 82  Wt. (Lbs) 163 167 168.4  BMI 31.83 kg/m2 32.61 kg/m2 32.89 kg/m2    Physical Exam Constitutional:      General: She is not in acute distress.    Appearance: Normal appearance.  HENT:     Head: Normocephalic and atraumatic.  Neck:     Comments: Goiter noted on exam. Cardiovascular:     Rate and Rhythm: Normal rate and regular rhythm.     Heart sounds: Murmur heard.  Pulmonary:     Effort: Pulmonary effort is normal. No respiratory distress.  Neurological:     Mental Status: She is alert.  Psychiatric:        Mood and Affect: Mood normal.        Behavior: Behavior normal.     Lab Results  Component Value Date   WBC 5.7 04/08/2022   HGB 13.2 04/08/2022   HCT 39.4 04/08/2022   PLT 233 04/08/2022   GLUCOSE 93 04/08/2022   CHOL 253 (H) 04/08/2022   TRIG 61 04/08/2022  HDL 99 04/08/2022   LDLCALC 144 (H) 04/08/2022   ALT 27 04/08/2022   AST 29 04/08/2022   NA 136 04/08/2022   K 4.0 04/08/2022   CL 99 04/08/2022   CREATININE 1.04 (H) 04/08/2022   BUN 19 04/08/2022   CO2 25 04/08/2022   TSH 1.750 01/26/2021     Assessment & Plan:   Problem List Items Addressed This Visit       Cardiovascular and Mediastinum   Essential hypertension, benign    Stable on current medications.  Continue.      Relevant Orders   CMP14+EGFR     Endocrine   Goiter - Primary    Ultrasound as well as TSH, free T4, and free T3 for evaluation.      Relevant Orders   US THYROID   TSH + free T4   T3, Free     Other   Hyperlipidemia LDL goal <70   Relevant Orders   Lipid panel    Follow-up:  Return in about 6 months (around 08/19/2023).  [place]

## 2023-02-16 NOTE — Assessment & Plan Note (Signed)
Stable on current medications.  Continue. 

## 2023-02-17 LAB — LIPID PANEL
Chol/HDL Ratio: 2.6 ratio (ref 0.0–4.4)
Cholesterol, Total: 234 mg/dL — ABNORMAL HIGH (ref 100–199)
HDL: 91 mg/dL (ref >39)
LDL Chol Calc (NIH): 130 mg/dL — ABNORMAL HIGH (ref 0–99)
Triglycerides: 78 mg/dL (ref 0–149)
VLDL Cholesterol Cal: 13 mg/dL (ref 5–40)

## 2023-02-17 LAB — CMP14+EGFR
ALT: 21 IU/L (ref 0–32)
AST: 23 IU/L (ref 0–40)
Albumin/Globulin Ratio: 1.2 (ref 1.2–2.2)
Albumin: 3.9 g/dL (ref 3.9–4.9)
Alkaline Phosphatase: 70 IU/L (ref 44–121)
BUN/Creatinine Ratio: 20 (ref 12–28)
BUN: 19 mg/dL (ref 8–27)
Bilirubin Total: 0.6 mg/dL (ref 0.0–1.2)
CO2: 22 mmol/L (ref 20–29)
Calcium: 9.7 mg/dL (ref 8.7–10.3)
Chloride: 102 mmol/L (ref 96–106)
Creatinine, Ser: 0.94 mg/dL (ref 0.57–1.00)
Globulin, Total: 3.2 g/dL (ref 1.5–4.5)
Glucose: 78 mg/dL (ref 70–99)
Potassium: 4 mmol/L (ref 3.5–5.2)
Sodium: 140 mmol/L (ref 134–144)
Total Protein: 7.1 g/dL (ref 6.0–8.5)
eGFR: 67 mL/min/{1.73_m2} (ref >59)

## 2023-02-17 LAB — TSH+FREE T4
Free T4: 1.25 ng/dL (ref 0.82–1.77)
TSH: 1.51 u[IU]/mL (ref 0.450–4.500)

## 2023-02-17 LAB — T3, FREE: T3, Free: 2.9 pg/mL (ref 2.0–4.4)

## 2023-02-24 ENCOUNTER — Other Ambulatory Visit: Payer: Self-pay | Admitting: Family Medicine

## 2023-02-24 ENCOUNTER — Ambulatory Visit (HOSPITAL_COMMUNITY)
Admission: RE | Admit: 2023-02-24 | Discharge: 2023-02-24 | Disposition: A | Discharge status: Home / Self Care | Payer: Medicare Other | Source: Ambulatory Visit | Attending: Family Medicine | Admitting: Family Medicine

## 2023-02-24 DIAGNOSIS — E049 Nontoxic goiter, unspecified: Secondary | ICD-10-CM | POA: Diagnosis not present

## 2023-02-24 DIAGNOSIS — E041 Nontoxic single thyroid nodule: Secondary | ICD-10-CM

## 2023-03-03 NOTE — Addendum Note (Signed)
Addended by: Dairl Ponder on: 03/03/2023 03:52 PM   Modules accepted: Orders

## 2023-03-07 ENCOUNTER — Other Ambulatory Visit: Payer: Self-pay | Admitting: Family Medicine

## 2023-03-07 DIAGNOSIS — E049 Nontoxic goiter, unspecified: Secondary | ICD-10-CM

## 2023-03-10 ENCOUNTER — Other Ambulatory Visit: Payer: Self-pay | Admitting: Radiology

## 2023-03-10 ENCOUNTER — Encounter (HOSPITAL_COMMUNITY): Payer: Self-pay

## 2023-03-10 ENCOUNTER — Ambulatory Visit (HOSPITAL_COMMUNITY)
Admission: RE | Admit: 2023-03-10 | Discharge: 2023-03-10 | Disposition: A | Discharge status: Home / Self Care | Payer: Medicare Other | Source: Ambulatory Visit | Attending: Family Medicine | Admitting: Family Medicine

## 2023-03-10 DIAGNOSIS — E049 Nontoxic goiter, unspecified: Secondary | ICD-10-CM

## 2023-03-10 DIAGNOSIS — D34 Benign neoplasm of thyroid gland: Secondary | ICD-10-CM | POA: Insufficient documentation

## 2023-03-10 DIAGNOSIS — E041 Nontoxic single thyroid nodule: Secondary | ICD-10-CM | POA: Diagnosis not present

## 2023-03-10 DIAGNOSIS — E042 Nontoxic multinodular goiter: Secondary | ICD-10-CM | POA: Diagnosis not present

## 2023-03-10 MED ORDER — LIDOCAINE HCL (PF) 2 % IJ SOLN
20.0000 mL | Freq: Once | INTRAMUSCULAR | Status: AC
  Administered 2023-03-10: 20 mL

## 2023-03-10 MED ORDER — LIDOCAINE HCL (PF) 2 % IJ SOLN
INTRAMUSCULAR | Status: AC
  Filled 2023-03-10: qty 20

## 2023-03-10 NOTE — Progress Notes (Signed)
Labs taken to lab for processing at 0929 by Richard from ultrasound.

## 2023-03-10 NOTE — Progress Notes (Signed)
PT tolerated bilateral thyroid biopsy procedure well today. Labs and afirma obtained and sent for pathology. PT ambulatory at discharge with no acute distress noted and verbalized understanding of discharge instructions.

## 2023-03-11 LAB — CYTOLOGY - NON PAP

## 2023-04-19 ENCOUNTER — Other Ambulatory Visit: Payer: Self-pay | Admitting: Family Medicine

## 2023-04-19 DIAGNOSIS — I10 Essential (primary) hypertension: Secondary | ICD-10-CM

## 2023-04-19 DIAGNOSIS — Z6832 Body mass index (BMI) 32.0-32.9, adult: Secondary | ICD-10-CM | POA: Diagnosis not present

## 2023-04-19 DIAGNOSIS — E669 Obesity, unspecified: Secondary | ICD-10-CM | POA: Diagnosis not present

## 2023-04-19 DIAGNOSIS — R609 Edema, unspecified: Secondary | ICD-10-CM | POA: Diagnosis not present

## 2023-04-21 DIAGNOSIS — H52223 Regular astigmatism, bilateral: Secondary | ICD-10-CM | POA: Diagnosis not present

## 2023-04-21 LAB — HM DIABETES EYE EXAM

## 2023-04-21 NOTE — Telephone Encounter (Signed)
Pt is out of medication

## 2023-04-26 ENCOUNTER — Encounter: Payer: Self-pay | Admitting: Family Medicine

## 2023-04-26 ENCOUNTER — Ambulatory Visit (INDEPENDENT_AMBULATORY_CARE_PROVIDER_SITE_OTHER): Payer: Medicare Other | Admitting: Family Medicine

## 2023-04-26 VITALS — BP 123/79 | HR 62 | Ht 60.0 in | Wt 161.0 lb

## 2023-04-26 DIAGNOSIS — R252 Cramp and spasm: Secondary | ICD-10-CM

## 2023-04-26 DIAGNOSIS — R739 Hyperglycemia, unspecified: Secondary | ICD-10-CM | POA: Diagnosis not present

## 2023-04-26 NOTE — Progress Notes (Signed)
Subjective:  Patient ID: Erin Foster, female    DOB: 08-Nov-1956  Age: 67 y.o. MRN: [id number]  CC: Chief Complaint  Patient presents with   Follow-up    Urgent care follow up for cramping in legs and feet    HPI:  67 year old female presents for follow-up regarding recent urgent care visit.  No records from urgent care visit available.  Patient states that she was seen on 21st.  She states that she had severe cramping of her left lower extremity which prompted her to go to urgent care.  She states that she was told she had edema and was advised to follow-up with me regarding this.  Patient reports cramping in her lower extremity particularly anteriorly.  She states that urgent care recommended an increase in her diuretic therapy.  She is currently on triamterene/HCTZ 37.5/25.  I have advised her that I do not recommend increasing the dose due to increased risk of side effects.  Additionally, patient states that she recently saw her eye doctor and he advised that she should get her sugar checked.  Patient Active Problem List   Diagnosis Date Noted   Leg cramps 04/26/2023   Goiter 02/16/2023   Preventative health care 01/10/2023   Essential hypertension, benign 07/24/2013   Hyperlipidemia LDL goal <70 07/24/2013   History of CVA (cerebrovascular accident) 07/24/2013    Social Hx   Social History   Socioeconomic History   Marital status: Married    Spouse name: Not on file   Number of children: Not on file   Years of education: Not on file   Highest education level: Not on file  Occupational History   Not on file  Tobacco Use   Smoking status: Never   Smokeless tobacco: Never  Vaping Use   Vaping Use: Never used  Substance and Sexual Activity   Alcohol use: No    Alcohol/week: 0.0 standard drinks of alcohol   Drug use: No   Sexual activity: Yes    Partners: Male    Birth control/protection: None, Post-menopausal  Other Topics Concern   Not on file  Social  History Narrative   Not on file   Social Determinants of Health   Financial Resource Strain: Not on file  Food Insecurity: Not on file  Transportation Needs: Not on file  Physical Activity: Not on file  Stress: Not on file  Social Connections: Not on file    Review of Systems Per HPI  Objective:  BP 123/79   Pulse 62   Ht 5' (1.524 m)   Wt 161 lb (73 kg)   SpO2 100%   BMI 31.44 kg/m      04/26/2023    9:23 AM 03/10/2023    8:45 AM 02/16/2023   11:13 AM  BP/Weight  Systolic BP 123 104 123  Diastolic BP 79 57 70  Wt. (Lbs) 161  163  BMI 31.44 kg/m2  31.83 kg/m2    Physical Exam Constitutional:      General: She is not in acute distress.    Appearance: Normal appearance.  HENT:     Head: Normocephalic and atraumatic.  Cardiovascular:     Rate and Rhythm: Normal rate and regular rhythm.     Comments: No lower extremity edema. Pulmonary:     Effort: Pulmonary effort is normal.     Breath sounds: Normal breath sounds. No wheezing, rhonchi or rales.  Neurological:     Mental Status: She is alert.  Psychiatric:  Mood and Affect: Mood normal.        Behavior: Behavior normal.     Lab Results  Component Value Date   WBC 5.7 04/08/2022   HGB 13.2 04/08/2022   HCT 39.4 04/08/2022   PLT 233 04/08/2022   GLUCOSE 78 02/16/2023   CHOL 234 (H) 02/16/2023   TRIG 78 02/16/2023   HDL 91 02/16/2023   LDLCALC 130 (H) 02/16/2023   ALT 21 02/16/2023   AST 23 02/16/2023   NA 140 02/16/2023   K 4.0 02/16/2023   CL 102 02/16/2023   CREATININE 0.94 02/16/2023   BUN 19 02/16/2023   CO2 22 02/16/2023   TSH 1.510 02/16/2023     Assessment & Plan:   Problem List Items Addressed This Visit       Other   Leg cramps - Primary    Advised to stay hydrated.  Stretching.  Has had recent labs.  Recommended against increasing diuretic therapy.  No edema on exam.      Other Visit Diagnoses     Elevated blood sugar       Relevant Orders   Hemoglobin A1c        Everlene Other DO [facility]

## 2023-04-26 NOTE — Assessment & Plan Note (Signed)
Advised to stay hydrated.  Stretching.  Has had recent labs.  Recommended against increasing diuretic therapy.  No edema on exam.

## 2023-04-28 DIAGNOSIS — R739 Hyperglycemia, unspecified: Secondary | ICD-10-CM | POA: Diagnosis not present

## 2023-04-29 LAB — HEMOGLOBIN A1C
Est. average glucose Bld gHb Est-mCnc: 128 mg/dL
Hgb A1c MFr Bld: 6.1 % — ABNORMAL HIGH (ref 4.8–5.6)

## 2023-05-07 ENCOUNTER — Other Ambulatory Visit: Payer: Self-pay | Admitting: Family Medicine

## 2023-05-07 DIAGNOSIS — I10 Essential (primary) hypertension: Secondary | ICD-10-CM

## 2023-05-13 ENCOUNTER — Telehealth: Payer: Self-pay | Admitting: Family Medicine

## 2023-05-13 ENCOUNTER — Other Ambulatory Visit: Payer: Self-pay | Admitting: Family Medicine

## 2023-05-13 DIAGNOSIS — I10 Essential (primary) hypertension: Secondary | ICD-10-CM

## 2023-05-13 MED ORDER — LISINOPRIL 5 MG PO TABS
5.0000 mg | ORAL_TABLET | Freq: Every day | ORAL | 1 refills | Status: DC
Start: 2023-05-13 — End: 2023-06-14

## 2023-05-13 NOTE — Telephone Encounter (Signed)
Refill on  lisinopril (ZESTRIL) 5 MG tablet  [facility] [place]

## 2023-06-08 ENCOUNTER — Other Ambulatory Visit: Payer: Self-pay | Admitting: Family Medicine

## 2023-06-08 ENCOUNTER — Encounter: Payer: Self-pay | Admitting: *Deleted

## 2023-06-08 DIAGNOSIS — I10 Essential (primary) hypertension: Secondary | ICD-10-CM

## 2023-06-14 ENCOUNTER — Other Ambulatory Visit: Payer: Self-pay | Admitting: *Deleted

## 2023-06-14 DIAGNOSIS — I10 Essential (primary) hypertension: Secondary | ICD-10-CM

## 2023-06-14 MED ORDER — LISINOPRIL 5 MG PO TABS
5.0000 mg | ORAL_TABLET | Freq: Every day | ORAL | 0 refills | Status: DC
Start: 2023-06-14 — End: 2024-03-06

## 2023-06-14 MED ORDER — SIMVASTATIN 20 MG PO TABS: 20.0000 mg | ORAL_TABLET | Freq: Every day | ORAL | 0 refills | Status: DC

## 2023-07-07 ENCOUNTER — Other Ambulatory Visit: Payer: Self-pay | Admitting: Family Medicine

## 2023-07-07 DIAGNOSIS — I10 Essential (primary) hypertension: Secondary | ICD-10-CM

## 2023-07-11 ENCOUNTER — Ambulatory Visit: Payer: Medicare Other | Admitting: Family Medicine

## 2023-07-17 ENCOUNTER — Emergency Department (HOSPITAL_COMMUNITY): Payer: Medicare Other

## 2023-07-17 ENCOUNTER — Other Ambulatory Visit: Payer: Self-pay

## 2023-07-17 ENCOUNTER — Encounter (HOSPITAL_COMMUNITY): Payer: Self-pay | Admitting: Emergency Medicine

## 2023-07-17 ENCOUNTER — Emergency Department (HOSPITAL_COMMUNITY)
Admission: EM | Admit: 2023-07-17 | Discharge: 2023-07-17 | Disposition: A | Discharge status: Home / Self Care | Payer: Medicare Other | Attending: Emergency Medicine | Admitting: Emergency Medicine

## 2023-07-17 DIAGNOSIS — J45909 Unspecified asthma, uncomplicated: Secondary | ICD-10-CM | POA: Diagnosis not present

## 2023-07-17 DIAGNOSIS — U071 COVID-19: Secondary | ICD-10-CM

## 2023-07-17 DIAGNOSIS — R55 Syncope and collapse: Secondary | ICD-10-CM | POA: Diagnosis present

## 2023-07-17 DIAGNOSIS — I10 Essential (primary) hypertension: Secondary | ICD-10-CM | POA: Diagnosis not present

## 2023-07-17 DIAGNOSIS — E876 Hypokalemia: Secondary | ICD-10-CM | POA: Diagnosis not present

## 2023-07-17 LAB — URINALYSIS, ROUTINE W REFLEX MICROSCOPIC
Bacteria, UA: NONE SEEN
Bilirubin Urine: NEGATIVE
Glucose, UA: NEGATIVE mg/dL
Hgb urine dipstick: NEGATIVE
Ketones, ur: NEGATIVE mg/dL
Nitrite: NEGATIVE
Protein, ur: NEGATIVE mg/dL
Specific Gravity, Urine: 1.013 (ref 1.005–1.030)
WBC, UA: >50 WBC/hpf (ref 0–5)
pH: 5 (ref 5.0–8.0)

## 2023-07-17 LAB — TROPONIN I (HIGH SENSITIVITY)
Troponin I (High Sensitivity): 5 ng/L (ref <18)
Troponin I (High Sensitivity): 6 ng/L (ref <18)

## 2023-07-17 LAB — BASIC METABOLIC PANEL
Anion gap: 10 (ref 5–15)
BUN: 19 mg/dL (ref 8–23)
CO2: 22 mmol/L (ref 22–32)
Calcium: 8.4 mg/dL — ABNORMAL LOW (ref 8.9–10.3)
Chloride: 101 mmol/L (ref 98–111)
Creatinine, Ser: 1.23 mg/dL — ABNORMAL HIGH (ref 0.44–1.00)
GFR, Estimated: 49 mL/min — ABNORMAL LOW (ref >60)
Glucose, Bld: 134 mg/dL — ABNORMAL HIGH (ref 70–99)
Potassium: 2.9 mmol/L — ABNORMAL LOW (ref 3.5–5.1)
Sodium: 133 mmol/L — ABNORMAL LOW (ref 135–145)

## 2023-07-17 LAB — CBC
HCT: 35 % — ABNORMAL LOW (ref 36.0–46.0)
Hemoglobin: 12 g/dL (ref 12.0–15.0)
MCH: 27.3 pg (ref 26.0–34.0)
MCHC: 34.3 g/dL (ref 30.0–36.0)
MCV: 79.7 fL — ABNORMAL LOW (ref 80.0–100.0)
Platelets: 181 10*3/uL (ref 150–400)
RBC: 4.39 MIL/uL (ref 3.87–5.11)
RDW: 14.2 % (ref 11.5–15.5)
WBC: 10.5 10*3/uL (ref 4.0–10.5)
nRBC: 0 % (ref 0.0–0.2)

## 2023-07-17 LAB — CBG MONITORING, ED: Glucose-Capillary: 136 mg/dL — ABNORMAL HIGH (ref 70–99)

## 2023-07-17 LAB — SARS CORONAVIRUS 2 BY RT PCR: SARS Coronavirus 2 by RT PCR: POSITIVE — AB

## 2023-07-17 MED ORDER — POTASSIUM CHLORIDE CRYS ER 20 MEQ PO TBCR
40.0000 meq | EXTENDED_RELEASE_TABLET | Freq: Once | ORAL | Status: AC
  Administered 2023-07-17: 40 meq via ORAL
  Filled 2023-07-17: qty 2

## 2023-07-17 NOTE — ED Provider Notes (Signed)
AP-EMERGENCY DEPT [facility] Emergency Department Provider Note MRN:  [id number]  Arrival date & time: 07/17/23     Chief Complaint   Loss of Consciousness   History of Present Illness   Erin Foster is a 67 y.o. year-old female with a history of hypertension, stroke presenting to the ED with chief complaint of loss of consciousness.  Patient's husband thinks that she lost consciousness.  There is sitting in the car.  Patient remembers feeling hot and nauseated.  Denies any chest pain or shortness of breath.  Currently feels normal.  Review of Systems  A thorough review of systems was obtained and all systems are negative except as noted in the HPI and PMH.   Patient's Health History    Past Medical History:  Diagnosis Date   Asthma    CVA (cerebral vascular accident) (HCC)    Hyperlipidemia    Hypertension     Past Surgical History:  Procedure Laterality Date   COLONOSCOPY      Family History  Problem Relation Age of Onset   Diabetes Mother    Hypertension Mother    Hyperlipidemia Mother    Cancer Maternal Aunt 4       breast   Hypertension Sister    Hypertension Sister     Social History   Socioeconomic History   Marital status: Married    Spouse name: Not on file   Number of children: Not on file   Years of education: Not on file   Highest education level: Not on file  Occupational History   Not on file  Tobacco Use   Smoking status: Never   Smokeless tobacco: Never  Vaping Use   Vaping status: Never Used  Substance and Sexual Activity   Alcohol use: No    Alcohol/week: 0.0 standard drinks of alcohol   Drug use: No   Sexual activity: Yes    Partners: Male    Birth control/protection: None, Post-menopausal  Other Topics Concern   Not on file  Social History Narrative   Not on file   Social Determinants of Health   Financial Resource Strain: Not on file  Food Insecurity: Not on file  Transportation Needs: Not on file  Physical  Activity: Not on file  Stress: Not on file  Social Connections: Not on file  Intimate Partner Violence: Not on file     Physical Exam   Vitals:   07/17/23 0420  BP: 124/66  Pulse: 64  Resp: 18  Temp: 98.6 F (37 C)  SpO2: 95%    CONSTITUTIONAL: Well-appearing, NAD NEURO/PSYCH:  Alert and oriented x 3, no focal deficits EYES:  eyes equal and reactive ENT/NECK:  no LAD, no JVD CARDIO: Regular rate, well-perfused, normal S1 and S2 PULM:  CTAB no wheezing or rhonchi GI/GU:  non-distended, non-tender MSK/SPINE:  No gross deformities, no edema SKIN:  no rash, atraumatic   *Additional and/or pertinent findings included in MDM below  Diagnostic and Interventional Summary    EKG Interpretation Date/Time:  Sunday July 17 2023 04:23:51 EDT Ventricular Rate:  63 PR Interval:  120 QRS Duration:  75 QT Interval:  406 QTC Calculation: 416 R Axis:   64  Text Interpretation: Sinus rhythm Consider right atrial enlargement Borderline repolarization abnormality Confirmed by Kennis Carina [phone]) on 07/17/2023 5:37:55 AM       Labs Reviewed  SARS CORONAVIRUS 2 BY RT PCR - Abnormal; Notable for the following components:      Result Value   SARS  Coronavirus 2 by RT PCR POSITIVE (*)    All other components within normal limits  BASIC METABOLIC PANEL - Abnormal; Notable for the following components:   Sodium 133 (*)    Potassium 2.9 (*)    Glucose, Bld 134 (*)    Creatinine, Ser 1.23 (*)    Calcium 8.4 (*)    GFR, Estimated 49 (*)    All other components within normal limits  CBC - Abnormal; Notable for the following components:   HCT 35.0 (*)    MCV 79.7 (*)    All other components within normal limits  CBG MONITORING, ED - Abnormal; Notable for the following components:   Glucose-Capillary 136 (*)    All other components within normal limits  URINALYSIS, ROUTINE W REFLEX MICROSCOPIC  TROPONIN I (HIGH SENSITIVITY)  TROPONIN I (HIGH SENSITIVITY)    DG Chest Port 1 View   Final Result      Medications  potassium chloride SA (KLOR-CON M) CR tablet 40 mEq (has no administration in time range)     Procedures  /  Critical Care Procedures  ED Course and Medical Decision Making  Initial Impression and Ddx Possible syncopal episode.  Patient is without symptoms currently.  Vitals reassuring.  Considering atypical presentation of ACS, arrhythmia, electrolyte disturbance.  Past medical/surgical history that increases complexity of ED encounter: Hypertension, stroke  Interpretation of Diagnostics I personally reviewed the EKG and my interpretation is as follows: Sinus rhythm with nonspecific findings  Labs reveal hypokalemia, otherwise no significant blood count or electrolyte disturbance.  Patient tested positive for COVID-19.  Troponin negative x 2.  Patient Reassessment and Ultimate Disposition/Management     Patient continues to look and feel well with no symptoms.  Blood pressure a bit soft on my latest evaluation however she is resting comfortably with a heart rate in the 60s.  She is warm and well-perfused.  She says her blood pressure normally runs low.  Nothing to suggest emergent process at this time.  Appropriate for discharge.  Patient management required discussion with the following services or consulting groups:  None  Complexity of Problems Addressed Acute illness or injury that poses threat of life of bodily function  Additional Data Reviewed and Analyzed Further history obtained from: Prior labs/imaging results  Additional Factors Impacting ED Encounter Risk None  Elmer Sow. Pilar Plate, MD [facility] Emergency Medicine [facility] [email or web address]  Final Clinical Impressions(s) / ED Diagnoses     ICD-10-CM   1. COVID-19  U07.1       ED Discharge Orders     None        Discharge Instructions Discussed with and Provided to Patient:     Discharge Instructions      You were evaluated in the Emergency  Department and after careful evaluation, we did not find any emergent condition requiring admission or further testing in the hospital.  Your exam/testing today is overall reassuring.  Symptoms likely related to dehydration in the setting of COVID-19 infection.  Drink plenty of fluids and get plenty of rest over the next few days.  Your potassium levels were low.  Increase the amount of potassium in your diet.  Talk to your regular doctor about your home medications.  Please return to the Emergency Department if you experience any worsening of your condition.   Thank you for allowing [place] to be a part of your care.       Sabas Sous, MD 07/17/23 [phone]

## 2023-07-17 NOTE — ED Triage Notes (Addendum)
Pt bib husband after she was riding with him on his morning paper route. States he believes she passed out. States she has been more weak since yesterday and that they recently traveled on an airplane. Pt reports nausea and runny nose. Pt thinks she may have Covid. Husband states they attempted to do at home test but "couldn't get it to work right". Pt does not believe she passed out, she thinks she just "didn't hear him calling her".

## 2023-07-17 NOTE — Discharge Instructions (Addendum)
You were evaluated in the Emergency Department and after careful evaluation, we did not find any emergent condition requiring admission or further testing in the hospital.  Your exam/testing today is overall reassuring.  Symptoms likely related to dehydration in the setting of COVID-19 infection.  Drink plenty of fluids and get plenty of rest over the next few days.  Your potassium levels were low.  Increase the amount of potassium in your diet.  Talk to your regular doctor about your home medications.  Please return to the Emergency Department if you experience any worsening of your condition.   Thank you for allowing [place] to be a part of your care.

## 2023-07-17 NOTE — ED Notes (Signed)
Reports chills, cough, runny nose since returning from trip to [place]

## 2023-07-22 ENCOUNTER — Telehealth: Payer: Self-pay

## 2023-07-22 NOTE — Telephone Encounter (Signed)
Transition Care Management Unsuccessful Follow-up Telephone Call  Date of discharge and from where:  07/17/2023 [facility]  Attempts:  1st Attempt  Reason for unsuccessful TCM follow-up call:  Left voice message  Indy Prestwood [facility]  [facility] [place] Population Health Community Resource Care Guide   [email or web address]  ?? [phone]   Website: [email or web address]  [email or web address]

## 2023-07-25 ENCOUNTER — Telehealth: Payer: Self-pay

## 2023-07-25 NOTE — Telephone Encounter (Signed)
Transition Care Management Unsuccessful Follow-up Telephone Call  Date of discharge and from where:  07/17/2023 [facility]  Attempts:  2nd Attempt  Reason for unsuccessful TCM follow-up call:  Left voice message  Dawnyel Leven [facility]  [facility] Population Health Community Resource Care Guide   [email or web address]  ?? [phone]   Website: [email or web address]

## 2023-08-07 ENCOUNTER — Other Ambulatory Visit: Payer: Self-pay | Admitting: Family Medicine

## 2023-08-07 DIAGNOSIS — I10 Essential (primary) hypertension: Secondary | ICD-10-CM

## 2023-08-19 ENCOUNTER — Ambulatory Visit: Payer: Medicare Other | Admitting: Family Medicine

## 2023-08-19 VITALS — BP 110/64 | HR 56 | Temp 97.7°F | Ht 60.0 in | Wt 162.6 lb

## 2023-08-19 DIAGNOSIS — Z23 Encounter for immunization: Secondary | ICD-10-CM

## 2023-08-19 DIAGNOSIS — R7303 Prediabetes: Secondary | ICD-10-CM

## 2023-08-19 DIAGNOSIS — R718 Other abnormality of red blood cells: Secondary | ICD-10-CM

## 2023-08-19 DIAGNOSIS — I10 Essential (primary) hypertension: Secondary | ICD-10-CM

## 2023-08-19 DIAGNOSIS — Z8673 Personal history of transient ischemic attack (TIA), and cerebral infarction without residual deficits: Secondary | ICD-10-CM

## 2023-08-19 DIAGNOSIS — K5909 Other constipation: Secondary | ICD-10-CM

## 2023-08-19 DIAGNOSIS — E785 Hyperlipidemia, unspecified: Secondary | ICD-10-CM

## 2023-08-19 MED ORDER — LINACLOTIDE 145 MCG PO CAPS: 145.0000 ug | ORAL_CAPSULE | Freq: Every day | ORAL | 3 refills | Status: DC

## 2023-08-19 NOTE — Assessment & Plan Note (Signed)
At goal. Continue current medications.

## 2023-08-19 NOTE — Patient Instructions (Signed)
Labs today.  Linzess sent.  Aspirin 81 mg daily.  Follow up in 6 months.

## 2023-08-19 NOTE — Progress Notes (Signed)
Subjective:  Patient ID: Erin Foster, female    DOB: 1956-05-25  Age: 67 y.o. MRN: [id number]  CC: Follow up   HPI:  67 year old female with hypertension, history of stroke, hyperlipidemia presents for follow-up.  Patient is interested in her flu vaccine.  Patient reports that she has an area of concern on her elbow that she would like me to look at.  No known injury.  She states that it is slightly tender.  Patient also reports ongoing chronic constipation.  She states that prune juice helps.  She would like to discuss starting Linzess for chronic constipation.  She states that she has had this for many many years.  Hypertension stable on lisinopril, triamterene/HCTZ, and amlodipine.  I have previously recommended that she switch to high intensity statin.  She has declined.   Patient Active Problem List   Diagnosis Date Noted   Chronic constipation 08/19/2023   Leg cramps 04/26/2023   Goiter 02/16/2023   Preventative health care 01/10/2023   Essential hypertension, benign 07/24/2013   Hyperlipidemia LDL goal <70 07/24/2013   History of CVA (cerebrovascular accident) 07/24/2013    Social Hx   Social History   Socioeconomic History   Marital status: Married    Spouse name: Not on file   Number of children: Not on file   Years of education: Not on file   Highest education level: Not on file  Occupational History   Not on file  Tobacco Use   Smoking status: Never   Smokeless tobacco: Never  Vaping Use   Vaping status: Never Used  Substance and Sexual Activity   Alcohol use: No    Alcohol/week: 0.0 standard drinks of alcohol   Drug use: No   Sexual activity: Yes    Partners: Male    Birth control/protection: None, Post-menopausal  Other Topics Concern   Not on file  Social History Narrative   Not on file   Social Determinants of Health   Financial Resource Strain: Not on file  Food Insecurity: Not on file  Transportation Needs: Not on file   Physical Activity: Not on file  Stress: Not on file  Social Connections: Not on file    Review of Systems Per HPI  Objective:  BP 110/64   Pulse (!) 56   Temp 97.7 F (36.5 C) (Oral)   Ht 5' (1.524 m)   Wt 162 lb 9.6 oz (73.8 kg)   SpO2 99%   BMI 31.76 kg/m      08/19/2023   10:00 AM 07/17/2023    7:40 AM 07/17/2023    4:22 AM  BP/Weight  Systolic BP 110 106   Diastolic BP 64 61   Wt. (Lbs) 162.6  168  BMI 31.76 kg/m2  32.81 kg/m2    Physical Exam Vitals and nursing note reviewed.  Constitutional:      General: She is not in acute distress.    Appearance: Normal appearance.  HENT:     Head: Normocephalic and atraumatic.  Eyes:     General:        Right eye: No discharge.     Conjunctiva/sclera: Conjunctivae normal.  Cardiovascular:     Rate and Rhythm: Normal rate and regular rhythm.     Comments: Soft systolic murmur best heard at upper right sternal border. Pulmonary:     Effort: Pulmonary effort is normal.     Breath sounds: Normal breath sounds. No wheezing, rhonchi or rales.  Abdominal:     Palpations: Abdomen  is soft.     Tenderness: There is no abdominal tenderness.  Skin:    Comments: Small firm, slightly raised area to the right elbow.  No fluctuance.  No erythema.  Mild central eschar.  Suspect injury.  Neurological:     Mental Status: She is alert.  Psychiatric:        Mood and Affect: Mood normal.        Behavior: Behavior normal.     Lab Results  Component Value Date   WBC 10.5 07/17/2023   HGB 12.0 07/17/2023   HCT 35.0 (L) 07/17/2023   PLT 181 07/17/2023   GLUCOSE 134 (H) 07/17/2023   CHOL 234 (H) 02/16/2023   TRIG 78 02/16/2023   HDL 91 02/16/2023   LDLCALC 130 (H) 02/16/2023   ALT 21 02/16/2023   AST 23 02/16/2023   NA 133 (L) 07/17/2023   K 2.9 (L) 07/17/2023   CL 101 07/17/2023   CREATININE 1.23 (H) 07/17/2023   BUN 19 07/17/2023   CO2 22 07/17/2023   TSH 1.510 02/16/2023   HGBA1C 6.1 (H) 04/28/2023     Assessment  & Plan:   Problem List Items Addressed This Visit       Cardiovascular and Mediastinum   Essential hypertension, benign - Primary    At goal.  Continue current medications.      Relevant Orders   CMP14+EGFR   Microalbumin / creatinine urine ratio     Digestive   Chronic constipation    Trial of Linzess.        Other   Hyperlipidemia LDL goal <70    Patient currently on statin.  Recommended transition to high intensity statin.  Patient has declined.  Reassessing lipids today.      Relevant Orders   Lipid panel   History of CVA (cerebrovascular accident)    Patient reported that she has not been taking aspirin.  Advised daily aspirin.      Other Visit Diagnoses     Needs flu shot       Relevant Orders   Flu vaccine trivalent PF, 6mos and older(Flulaval,Afluria,Fluarix,Fluzone) (Completed)   Prediabetes       Relevant Orders   Hemoglobin A1c   Microcytosis       Relevant Orders   CBC       Meds ordered this encounter  Medications   linaclotide (LINZESS) 145 MCG CAPS capsule    Sig: Take 1 capsule (145 mcg total) by mouth daily before breakfast.    Dispense:  30 capsule    Refill:  3    Follow-up:  Return in about 6 months (around 02/16/2024).  Everlene Other DO [facility]

## 2023-08-19 NOTE — Assessment & Plan Note (Signed)
Patient reported that she has not been taking aspirin.  Advised daily aspirin.

## 2023-08-19 NOTE — Assessment & Plan Note (Signed)
Patient currently on statin.  Recommended transition to high intensity statin.  Patient has declined.  Reassessing lipids today.

## 2023-08-19 NOTE — Assessment & Plan Note (Signed)
Trial of Linzess.

## 2023-08-22 DIAGNOSIS — E785 Hyperlipidemia, unspecified: Secondary | ICD-10-CM | POA: Diagnosis not present

## 2023-08-22 DIAGNOSIS — R718 Other abnormality of red blood cells: Secondary | ICD-10-CM | POA: Diagnosis not present

## 2023-08-22 DIAGNOSIS — I10 Essential (primary) hypertension: Secondary | ICD-10-CM | POA: Diagnosis not present

## 2023-08-22 DIAGNOSIS — R7303 Prediabetes: Secondary | ICD-10-CM | POA: Diagnosis not present

## 2023-08-23 LAB — CMP14+EGFR
ALT: 31 IU/L (ref 0–32)
AST: 31 IU/L (ref 0–40)
Albumin: 4 g/dL (ref 3.9–4.9)
Alkaline Phosphatase: 85 IU/L (ref 44–121)
BUN/Creatinine Ratio: 25 (ref 12–28)
BUN: 23 mg/dL (ref 8–27)
Bilirubin Total: 0.4 mg/dL (ref 0.0–1.2)
CO2: 24 mmol/L (ref 20–29)
Calcium: 9.5 mg/dL (ref 8.7–10.3)
Chloride: 102 mmol/L (ref 96–106)
Creatinine, Ser: 0.93 mg/dL (ref 0.57–1.00)
Globulin, Total: 3 g/dL (ref 1.5–4.5)
Glucose: 86 mg/dL (ref 70–99)
Potassium: 4.2 mmol/L (ref 3.5–5.2)
Sodium: 139 mmol/L (ref 134–144)
Total Protein: 7 g/dL (ref 6.0–8.5)
eGFR: 68 mL/min/{1.73_m2} (ref >59)

## 2023-08-23 LAB — CBC
Hematocrit: 40.8 % (ref 34.0–46.6)
Hemoglobin: 12.8 g/dL (ref 11.1–15.9)
MCH: 26.2 pg — ABNORMAL LOW (ref 26.6–33.0)
MCHC: 31.4 g/dL — ABNORMAL LOW (ref 31.5–35.7)
MCV: 84 fL (ref 79–97)
Platelets: 250 10*3/uL (ref 150–450)
RBC: 4.88 x10E6/uL (ref 3.77–5.28)
RDW: 14.1 % (ref 11.7–15.4)
WBC: 6.2 10*3/uL (ref 3.4–10.8)

## 2023-08-23 LAB — LIPID PANEL
Chol/HDL Ratio: 2.4 ratio (ref 0.0–4.4)
Cholesterol, Total: 230 mg/dL — ABNORMAL HIGH (ref 100–199)
HDL: 97 mg/dL (ref >39)
LDL Chol Calc (NIH): 124 mg/dL — ABNORMAL HIGH (ref 0–99)
Triglycerides: 51 mg/dL (ref 0–149)
VLDL Cholesterol Cal: 9 mg/dL (ref 5–40)

## 2023-08-23 LAB — MICROALBUMIN / CREATININE URINE RATIO
Creatinine, Urine: 68.4 mg/dL
Microalb/Creat Ratio: 7 mg/g creat (ref 0–29)
Microalbumin, Urine: 4.8 ug/mL

## 2023-08-23 LAB — HEMOGLOBIN A1C
Est. average glucose Bld gHb Est-mCnc: 128 mg/dL
Hgb A1c MFr Bld: 6.1 % — ABNORMAL HIGH (ref 4.8–5.6)

## 2023-09-09 ENCOUNTER — Other Ambulatory Visit: Payer: Self-pay | Admitting: Family Medicine

## 2023-09-09 DIAGNOSIS — I10 Essential (primary) hypertension: Secondary | ICD-10-CM

## 2023-09-10 DIAGNOSIS — M25552 Pain in left hip: Secondary | ICD-10-CM | POA: Diagnosis not present

## 2023-09-10 DIAGNOSIS — M79652 Pain in left thigh: Secondary | ICD-10-CM | POA: Diagnosis not present

## 2023-09-10 DIAGNOSIS — M79605 Pain in left leg: Secondary | ICD-10-CM | POA: Diagnosis not present

## 2023-09-10 DIAGNOSIS — M79602 Pain in left arm: Secondary | ICD-10-CM | POA: Diagnosis not present

## 2023-09-10 DIAGNOSIS — S79912A Unspecified injury of left hip, initial encounter: Secondary | ICD-10-CM | POA: Diagnosis not present

## 2023-09-10 DIAGNOSIS — M79606 Pain in leg, unspecified: Secondary | ICD-10-CM | POA: Diagnosis not present

## 2023-09-10 DIAGNOSIS — M25512 Pain in left shoulder: Secondary | ICD-10-CM | POA: Diagnosis not present

## 2023-10-07 ENCOUNTER — Ambulatory Visit (INDEPENDENT_AMBULATORY_CARE_PROVIDER_SITE_OTHER): Payer: Medicare Other | Admitting: Family Medicine

## 2023-10-07 VITALS — BP 101/66 | HR 61 | Ht 60.0 in | Wt 163.0 lb

## 2023-10-07 DIAGNOSIS — M79605 Pain in left leg: Secondary | ICD-10-CM

## 2023-10-07 DIAGNOSIS — M79604 Pain in right leg: Secondary | ICD-10-CM

## 2023-10-07 NOTE — Patient Instructions (Signed)
Continue your medications.  Labs today.  Take care  Dr. Adriana Simas

## 2023-10-07 NOTE — Assessment & Plan Note (Signed)
Obtaining metabolic panel to assess for underlying disturbance.  D-dimer as well.  Low likelihood for clot at this time.

## 2023-10-07 NOTE — Progress Notes (Signed)
Subjective:  Patient ID: Erin Foster, female    DOB: Mar 07, 1956  Age: 67 y.o. MRN: [id number]  CC: Pain/cramping in the lower extremities.   HPI:  67 year old female presents for evaluation of the above.  Patient reports that she has intermittent cramping and associated swelling of the lower extremities.  She states it happens about once every 2 weeks.  It is not severe.  She states that she has a family history of DVT and PE.  She is concerned about the possibility of clot.  Patient is requesting ultrasound today.  Denies chest pain.  Denies shortness of breath.  Patient Active Problem List   Diagnosis Date Noted   Pain in both lower extremities 10/07/2023   Chronic constipation 08/19/2023   Leg cramps 04/26/2023   Goiter 02/16/2023   Preventative health care 01/10/2023   Essential hypertension, benign 07/24/2013   Hyperlipidemia LDL goal <70 07/24/2013   History of CVA (cerebrovascular accident) 07/24/2013    Social Hx   Social History   Socioeconomic History   Marital status: Married    Spouse name: Not on file   Number of children: Not on file   Years of education: Not on file   Highest education level: Not on file  Occupational History   Not on file  Tobacco Use   Smoking status: Never   Smokeless tobacco: Never  Vaping Use   Vaping status: Never Used  Substance and Sexual Activity   Alcohol use: No    Alcohol/week: 0.0 standard drinks of alcohol   Drug use: No   Sexual activity: Yes    Partners: Male    Birth control/protection: None, Post-menopausal  Other Topics Concern   Not on file  Social History Narrative   Not on file   Social Determinants of Health   Financial Resource Strain: Not on file  Food Insecurity: Not on file  Transportation Needs: Not on file  Physical Activity: Not on file  Stress: Not on file  Social Connections: Not on file    Review of Systems Per HPI  Objective:  BP 101/66   Pulse 61   Ht 5' (1.524 m)   Wt 163  lb (73.9 kg)   SpO2 100%   BMI 31.83 kg/m      10/07/2023    1:57 PM 08/19/2023   10:00 AM 07/17/2023    7:40 AM  BP/Weight  Systolic BP 101 110 106  Diastolic BP 66 64 61  Wt. (Lbs) 163 162.6   BMI 31.83 kg/m2 31.76 kg/m2     Physical Exam Vitals and nursing note reviewed.  Constitutional:      General: She is not in acute distress.    Appearance: Normal appearance.  HENT:     Head: Normocephalic and atraumatic.  Cardiovascular:     Rate and Rhythm: Normal rate and regular rhythm.  Pulmonary:     Effort: Pulmonary effort is normal. No respiratory distress.  Musculoskeletal:     Comments: No tenderness or swelling of the lower extremities bilaterally.  Neurological:     Mental Status: She is alert.     Lab Results  Component Value Date   WBC 6.2 08/22/2023   HGB 12.8 08/22/2023   HCT 40.8 08/22/2023   PLT 250 08/22/2023   GLUCOSE 86 08/22/2023   CHOL 230 (H) 08/22/2023   TRIG 51 08/22/2023   HDL 97 08/22/2023   LDLCALC 124 (H) 08/22/2023   ALT 31 08/22/2023   AST 31 08/22/2023   NA  139 08/22/2023   K 4.2 08/22/2023   CL 102 08/22/2023   CREATININE 0.93 08/22/2023   BUN 23 08/22/2023   CO2 24 08/22/2023   TSH 1.510 02/16/2023   HGBA1C 6.1 (H) 08/22/2023     Assessment & Plan:   Problem List Items Addressed This Visit       Other   Pain in both lower extremities - Primary    Obtaining metabolic panel to assess for underlying disturbance.  D-dimer as well.  Low likelihood for clot at this time.      Relevant Orders   D-dimer, quantitative   Basic metabolic panel   Everlene Other DO [facility]

## 2023-10-08 LAB — BASIC METABOLIC PANEL
BUN/Creatinine Ratio: 23 (ref 12–28)
BUN: 25 mg/dL (ref 8–27)
CO2: 24 mmol/L (ref 20–29)
Calcium: 9.6 mg/dL (ref 8.7–10.3)
Chloride: 100 mmol/L (ref 96–106)
Creatinine, Ser: 1.07 mg/dL — ABNORMAL HIGH (ref 0.57–1.00)
Glucose: 89 mg/dL (ref 70–99)
Potassium: 4.5 mmol/L (ref 3.5–5.2)
Sodium: 138 mmol/L (ref 134–144)
eGFR: 57 mL/min/{1.73_m2} — ABNORMAL LOW (ref >59)

## 2023-10-08 LAB — D-DIMER, QUANTITATIVE: D-DIMER: 0.48 mg{FEU}/L (ref 0.00–0.49)

## 2023-10-09 ENCOUNTER — Other Ambulatory Visit: Payer: Self-pay | Admitting: Family Medicine

## 2023-10-09 DIAGNOSIS — I10 Essential (primary) hypertension: Secondary | ICD-10-CM

## 2023-11-04 ENCOUNTER — Encounter: Payer: Medicare Other | Admitting: Family Medicine

## 2023-11-08 ENCOUNTER — Ambulatory Visit (INDEPENDENT_AMBULATORY_CARE_PROVIDER_SITE_OTHER): Payer: Medicare Other | Admitting: Family Medicine

## 2023-11-08 ENCOUNTER — Other Ambulatory Visit: Payer: Self-pay | Admitting: Family Medicine

## 2023-11-08 VITALS — BP 109/66 | HR 57 | Temp 97.0°F | Ht 60.0 in | Wt 161.6 lb

## 2023-11-08 DIAGNOSIS — Z0000 Encounter for general adult medical examination without abnormal findings: Secondary | ICD-10-CM

## 2023-11-08 NOTE — Progress Notes (Signed)
Subjective:   Erin Foster is a 67 y.o. female who presents for an Initial Medicare Annual Wellness Visit.  Patient doing well at this time. Occasional leg cramps. No chest pain or SOB.   Objective:    Today's Vitals   11/08/23 0910  BP: 109/66  Pulse: (!) 57  Temp: (!) 97 F (36.1 C)  SpO2: 100%  Weight: 161 lb 9.6 oz (73.3 kg)  Height: 5' (1.524 m)   Body mass index is 31.56 kg/m. Physical Exam   Gen.: Well-appearing elderly female in no acute distress. HENT: Normocephalic atraumatic.  Eyes: No scleral icterus. Normal conjunctiva. Neck: Supple.  Heart: Regular rate and rhythm. Soft systolic murmur. No lower extremity edema. Lungs: Clear auscultation bilaterally. No rales, rhonchi, or wheezing. Abdomen: Soft, nontender, nondistended. No palpable organomegaly. No rebound or guarding. MSK: Normal range of motion. Neuro: No focal deficits. Psych: Normal mood and affect.       02/06/2021   10:14 AM  Advanced Directives  Does Patient Have a Medical Advance Directive? No    Current Medications (verified) Outpatient Encounter Medications as of 11/08/2023  Medication Sig   amLODipine (NORVASC) 5 MG tablet Take 1 tablet by mouth once daily   aspirin 81 MG tablet Take 81 mg by mouth daily.   linaclotide (LINZESS) 145 MCG CAPS capsule Take 1 capsule (145 mcg total) by mouth daily before breakfast.   lisinopril (ZESTRIL) 5 MG tablet Take 1 tablet (5 mg total) by mouth daily.   simvastatin (ZOCOR) 20 MG tablet Take 1 tablet (20 mg total) by mouth at bedtime.   triamterene-hydrochlorothiazide (DYAZIDE) 37.5-25 MG capsule Take 1 capsule by mouth once daily   No facility-administered encounter medications on file as of 11/08/2023.    Allergies (verified) Patient has no known allergies.   History: Past Medical History:  Diagnosis Date   Asthma    CVA (cerebral vascular accident) (HCC)    Hyperlipidemia    Hypertension    Past Surgical History:  Procedure  Laterality Date   COLONOSCOPY     Family History  Problem Relation Age of Onset   Diabetes Mother    Hypertension Mother    Hyperlipidemia Mother    Cancer Maternal Aunt 16       breast   Hypertension Sister    Hypertension Sister    Social History   Socioeconomic History   Marital status: Married    Spouse name: Not on file   Number of children: Not on file   Years of education: Not on file   Highest education level: Not on file  Occupational History   Not on file  Tobacco Use   Smoking status: Never   Smokeless tobacco: Never  Vaping Use   Vaping status: Never Used  Substance and Sexual Activity   Alcohol use: No    Alcohol/week: 0.0 standard drinks of alcohol   Drug use: No   Sexual activity: Yes    Partners: Male    Birth control/protection: None, Post-menopausal  Other Topics Concern   Not on file  Social History Narrative   Not on file   Social Determinants of Health   Financial Resource Strain: Not on file  Food Insecurity: Not on file  Transportation Needs: Not on file  Physical Activity: Not on file  Stress: Not on file  Social Connections: Not on file    Tobacco Counseling: N/A; Nonsmoker  Patient Care Team: Tommie Sams, DO as PCP - General (Family Medicine)  Indicate any  recent Medical Services you may have received from other than [facility] providers in the past year (date may be approximate). GI @ [facility]    Assessment:   This is a routine wellness examination for Erin Foster.  Hearing/Vision screen No issues/concerns.   Goals Addressed   None    Depression Screen    11/08/2023    9:13 AM 10/07/2023    2:01 PM 08/19/2023    9:58 AM 04/26/2023    9:24 AM 02/16/2023   11:17 AM 01/10/2023   10:18 AM 10/06/2021   10:11 AM  PHQ 2/9 Scores  PHQ - 2 Score 0 0 0 0 0 0 0  PHQ- 9 Score 0 2   0 0     Fall Risk    11/08/2023    9:13 AM 10/07/2023    2:00 PM 08/19/2023    9:57 AM 04/26/2023    9:23 AM 10/19/2022    4:37 PM  Fall Risk   Falls in  the past year? 0 1 0 0 1  Number falls in past yr:  0 0 0 0  Injury with Fall?  0 0 0 0    MEDICARE RISK AT HOME:  Low risk. No steps in the home. No falls this year.  TIMED UP AND GO:  Was the test performed? No.  Not needed this patient is fully independent and ambulatory without any difficulty.  Cognitive Function: Normal cognitive function.  Immunizations Immunization History  Administered Date(s) Administered   Influenza, Seasonal, Injecte, Preservative Fre 08/19/2023   Influenza,inj,Quad PF,6+ Mos 09/20/2017, 10/17/2018, 10/06/2021   Moderna Sars-Covid-2 Vaccination 03/25/2020, 04/16/2020   Tdap 12/25/2013    TDAP status: Up to date  Flu Vaccine status: Up to date  Pneumococcal vaccine status: Declined,  Education has been provided regarding the importance of this vaccine but patient still declined. Advised may receive this vaccine at local pharmacy or Health Dept. Aware to provide a copy of the vaccination record if obtained from local pharmacy or Health Dept. Verbalized acceptance and understanding.   Covid-19 vaccine status: Declined, Education has been provided regarding the importance of this vaccine but patient still declined. Advised may receive this vaccine at local pharmacy or Health Dept.or vaccine clinic. Aware to provide a copy of the vaccination record if obtained from local pharmacy or Health Dept. Verbalized acceptance and understanding.  Qualifies for Shingles Vaccine? Yes   Declines Shingrix.  Screening Tests Health Maintenance  Topic Date Due   Medicare Annual Wellness (AWV)  Never done   Zoster Vaccines- Shingrix (1 of 2) Never done   Pneumonia Vaccine 77+ Years old (1 of 1 - PCV) 01/11/2024 (Originally 09/12/2022)   DTaP/Tdap/Td (2 - Td or Tdap) 12/26/2023   MAMMOGRAM  02/14/2024   Colonoscopy  01/24/2033   INFLUENZA VACCINE  Completed   DEXA SCAN  Completed   HPV VACCINES  Aged Out   COVID-19 Vaccine  Discontinued   Hepatitis C Screening   Discontinued    Health Maintenance  Health Maintenance Due  Topic Date Due   Medicare Annual Wellness (AWV)  Never done   Zoster Vaccines- Shingrix (1 of 2) Never done    Colorectal cancer screening: Type of screening: Colonoscopy. Completed 01/24/23. Repeat every 10 years  Mammogram status: Completed 02/14/23. Repeat every year  Bone Density status: Completed 02/14/23. Results reflect: Bone density results: OSTEOPENIA. Repeat every 2 years.  Lung Cancer Screening: (Low Dose CT Chest recommended if Age 46-80 years, 20 pack-year currently smoking OR have quit w/in  15years.) does not qualify.   Additional Screening:  Hepatitis C Screening: does qualify; Declined.  Vision Screening: Recommended annual ophthalmology exams for early detection of glaucoma and other disorders of the eye. Is the patient up to date with their annual eye exam?  Yes   Dental Screening: Recommended annual dental exams for proper oral hygiene     Plan:     I have personally reviewed and noted the following in the patient's chart:   Medical and social history Use of alcohol, tobacco or illicit drugs  Current medications and supplements. Functional ability and status Nutritional status Physical activity Advanced directives List of other physicians Hospitalizations, surgeries, and ER visits in previous 12 months Vitals Screenings to include cognitive, depression, and falls Referrals and appointments  In addition, I have reviewed and discussed with patient certain preventive protocols, quality metrics, and best practice recommendations. A written personalized care plan for preventive services as well as general preventive health recommendations were provided to patient.     Tommie Sams, DO   11/08/2023   After Visit Summary: (In Person-Printed) AVS printed and given to the patient

## 2023-11-08 NOTE — Patient Instructions (Signed)
Continue your medications.    Follow up in 6 months

## 2023-12-12 ENCOUNTER — Other Ambulatory Visit: Payer: Self-pay | Admitting: Family Medicine

## 2023-12-12 DIAGNOSIS — I10 Essential (primary) hypertension: Secondary | ICD-10-CM

## 2023-12-12 NOTE — Telephone Encounter (Signed)
 Copied from CRM [id number]. Topic: Clinical - Medication Refill >> Dec 12, 2023 12:12 PM Kinnie DEL wrote: Most Recent Primary Care Visit:  Provider: BLUFORD JACQULYN MATSU  Department: [facility]  Visit Type: WELCOME TO MEDICARE  Date: 11/08/2023  Medication: triamterene -hydrochlorothiazide (DYAZIDE) 37.5-25 MG capsule   Has the patient contacted their pharmacy? Yes (Agent: If no, request that the patient contact the pharmacy for the refill. If patient does not wish to contact the pharmacy document the reason why and proceed with request.) (Agent: If yes, when and what did the pharmacy advise?)Needs to contact doctor   Is this the correct pharmacy for this prescription? Yes If no, delete pharmacy and type the correct one.  This is the patient's preferred pharmacy:  [facility] [address] [place] - [address] [place] [address] [place] [address] Phone: [phone] Fax: [phone]  [facility]) - [place], [place] - [address] [facility] [address] [place] [address] [place] [address] Phone: [phone] Fax: [phone]   Has the prescription been filled recently? No  Is the patient out of the medication? Yes  Has the patient been seen for an appointment in the last year OR does the patient have an upcoming appointment? Yes  Can we respond through MyChart? No  Agent: Please be advised that Rx refills may take up to 3 business days. We ask that you follow-up with your pharmacy.

## 2023-12-22 ENCOUNTER — Other Ambulatory Visit (HOSPITAL_COMMUNITY): Payer: Self-pay | Admitting: Family Medicine

## 2023-12-22 DIAGNOSIS — Z1231 Encounter for screening mammogram for malignant neoplasm of breast: Secondary | ICD-10-CM

## 2024-01-09 ENCOUNTER — Other Ambulatory Visit: Payer: Self-pay | Admitting: Family Medicine

## 2024-01-09 ENCOUNTER — Other Ambulatory Visit: Payer: Self-pay

## 2024-01-09 DIAGNOSIS — I10 Essential (primary) hypertension: Secondary | ICD-10-CM

## 2024-01-09 MED ORDER — TRIAMTERENE-HCTZ 37.5-25 MG PO CAPS: 1.0000 | ORAL_CAPSULE | Freq: Every day | ORAL | 3 refills | Status: DC

## 2024-02-10 ENCOUNTER — Other Ambulatory Visit: Payer: Self-pay | Admitting: Family Medicine

## 2024-02-10 DIAGNOSIS — E785 Hyperlipidemia, unspecified: Secondary | ICD-10-CM

## 2024-02-10 NOTE — Telephone Encounter (Signed)
 Copied from CRM [id number]. Topic: Clinical - Medication Refill >> Feb 10, 2024  1:52 PM Everette C wrote: Most Recent Primary Care Visit:  Provider: Tommie Sams  Department: [facility]  Visit Type: WELCOME TO MEDICARE  Date: 11/08/2023  Medication: simvastatin (ZOCOR) 20 MG tablet [[id number]]  Has the patient contacted their pharmacy? Yes (Agent: If no, request that the patient contact the pharmacy for the refill. If patient does not wish to contact the pharmacy document the reason why and proceed with request.) (Agent: If yes, when and what did the pharmacy advise?)  Is this the correct pharmacy for this prescription? Yes If no, delete pharmacy and type the correct one.  This is the patient's preferred pharmacy:  [facility] [address] [place] - [address] [place] [address] [place] [address] [address] [place] [address] Phone: [phone] Fax: [phone]  Has the prescription been filled recently? Yes  Is the patient out of the medication? No  Has the patient been seen for an appointment in the last year OR does the patient have an upcoming appointment? Yes  Can we respond through MyChart? No  Agent: Please be advised that Rx refills may take up to 3 business days. We ask that you follow-up with your pharmacy.

## 2024-02-10 NOTE — Telephone Encounter (Signed)
 Per chart review, Simvastatin medication was sent to [facility] pharmacy on file. Receipt was confirmed by pharmacy (02/10/2024  1:54 PM EDT)

## 2024-02-16 ENCOUNTER — Ambulatory Visit (HOSPITAL_COMMUNITY): Payer: Medicare Other

## 2024-02-16 ENCOUNTER — Ambulatory Visit: Payer: Medicare Other | Admitting: Family Medicine

## 2024-02-16 VITALS — BP 110/68 | HR 57 | Temp 98.2°F | Ht 60.0 in | Wt 162.0 lb

## 2024-02-16 DIAGNOSIS — E785 Hyperlipidemia, unspecified: Secondary | ICD-10-CM

## 2024-02-16 DIAGNOSIS — K5909 Other constipation: Secondary | ICD-10-CM

## 2024-02-16 DIAGNOSIS — Z130 Encounter for screening for diseases of the blood and blood-forming organs and certain disorders involving the immune mechanism: Secondary | ICD-10-CM | POA: Diagnosis not present

## 2024-02-16 DIAGNOSIS — R7303 Prediabetes: Secondary | ICD-10-CM

## 2024-02-16 DIAGNOSIS — I10 Essential (primary) hypertension: Secondary | ICD-10-CM | POA: Diagnosis not present

## 2024-02-16 DIAGNOSIS — R252 Cramp and spasm: Secondary | ICD-10-CM

## 2024-02-16 MED ORDER — LINACLOTIDE 145 MCG PO CAPS: 145.0000 ug | ORAL_CAPSULE | Freq: Every day | ORAL | 3 refills | Status: DC

## 2024-02-16 NOTE — Assessment & Plan Note (Signed)
 Patient did not start Linzess previously.  We discussed this today.  Restarting.

## 2024-02-16 NOTE — Assessment & Plan Note (Signed)
 Uncontrolled.  Patient has been reluctant to change to high intensity statin therapy.  Continuing simvastatin for now.

## 2024-02-16 NOTE — Progress Notes (Signed)
 Subjective:  Patient ID: Erin Foster, female    DOB: [birth year]  Age: 68 y.o. MRN: [id number]  CC:  Follow up   HPI:  68 year old female presents for follow up.  Patient continues to have nocturnal leg cramps. Will discuss today. Also continues to have chronic constipation.  BP stable on Norvasc, Lisinopril, ad Triamterene hydrochlorothiazide.  Will discuss discontinuation for triamterene HCTZ given cramping.  Patient is a candidate for pneumococcal vaccination.  Declines today.  Patient needs labs today.   Patient Active Problem List   Diagnosis Date Noted   Chronic constipation 08/19/2023   Leg cramps 04/26/2023   Goiter 02/16/2023   Preventative health care 01/10/2023   Essential hypertension, benign 07/24/2013   Hyperlipidemia LDL goal <70 07/24/2013   History of CVA (cerebrovascular accident) 07/24/2013    Social Hx   Social History   Socioeconomic History   Marital status: Married    Spouse name: Not on file   Number of children: Not on file   Years of education: Not on file   Highest education level: Not on file  Occupational History   Not on file  Tobacco Use   Smoking status: Never   Smokeless tobacco: Never  Vaping Use   Vaping status: Never Used  Substance and Sexual Activity   Alcohol use: No    Alcohol/week: 0.0 standard drinks of alcohol   Drug use: No   Sexual activity: Yes    Partners: Male    Birth control/protection: None, Post-menopausal  Other Topics Concern   Not on file  Social History Narrative   Not on file   Social Drivers of Health   Financial Resource Strain: Not on file  Food Insecurity: Not on file  Transportation Needs: Not on file  Physical Activity: Not on file  Stress: Not on file  Social Connections: Not on file    Review of Systems Per HPI  Objective:  BP 110/68   Pulse (!) 57   Temp 98.2 F (36.8 C)   Ht 5' (1.524 m)   Wt 162 lb (73.5 kg)   SpO2 100%   BMI 31.64 kg/m      02/16/2024   10:21  AM 11/08/2023    9:10 AM 10/07/2023    1:57 PM  BP/Weight  Systolic BP 110 109 101  Diastolic BP 68 66 66  Wt. (Lbs) 162 161.6 163  BMI 31.64 kg/m2 31.56 kg/m2 31.83 kg/m2    Physical Exam Vitals and nursing note reviewed.  Constitutional:      General: She is not in acute distress.    Appearance: Normal appearance.  HENT:     Head: Normocephalic and atraumatic.  Eyes:     General:        Right eye: No discharge.        Left eye: No discharge.     Conjunctiva/sclera: Conjunctivae normal.  Cardiovascular:     Rate and Rhythm: Normal rate and regular rhythm.  Pulmonary:     Effort: Pulmonary effort is normal.     Breath sounds: Normal breath sounds. No wheezing, rhonchi or rales.  Neurological:     Mental Status: She is alert.     Lab Results  Component Value Date   WBC 6.2 08/22/2023   HGB 12.8 08/22/2023   HCT 40.8 08/22/2023   PLT 250 08/22/2023   GLUCOSE 89 10/07/2023   CHOL 230 (H) 08/22/2023   TRIG 51 08/22/2023   HDL 97 08/22/2023   LDLCALC 124 (  H) 08/22/2023   ALT 31 08/22/2023   AST 31 08/22/2023   NA 138 10/07/2023   K 4.5 10/07/2023   CL 100 10/07/2023   CREATININE 1.07 (H) 10/07/2023   BUN 25 10/07/2023   CO2 24 10/07/2023   TSH 1.510 02/16/2023   HGBA1C 6.1 (H) 08/22/2023     Assessment & Plan:  Essential hypertension, benign Assessment & Plan: Stable. Stopping Triamterene hydrochlorothiazide.  Orders: -     CMP14+EGFR -     Microalbumin / creatinine urine ratio  Hyperlipidemia LDL goal <70 Assessment & Plan: Uncontrolled.  Patient has been reluctant to change to high intensity statin therapy.  Continuing simvastatin for now.  Orders: -     Lipid panel  Prediabetes -     Hemoglobin A1c  Screening for deficiency anemia -     CBC  Leg cramps Assessment & Plan: Stopping Triamterene/hydrochlorothiazide.  Advised stretching.   Chronic constipation Assessment & Plan: Patient did not start Linzess previously.  We discussed this  today.  Restarting.  Orders: -     linaCLOtide; Take 1 capsule (145 mcg total) by mouth daily before breakfast.  Dispense: 30 capsule; Refill: 3    Follow-up:  Return in about 6 months (around 08/18/2024).  Everlene Other DO [facility]

## 2024-02-16 NOTE — Assessment & Plan Note (Signed)
 Stopping Triamterene/hydrochlorothiazide.  Advised stretching.

## 2024-02-16 NOTE — Patient Instructions (Signed)
 Stop Triamterene hydrochlorothiazide.  Start Linzess.  Labs today.  Follow up in 6 months. Watch BP.

## 2024-02-16 NOTE — Assessment & Plan Note (Signed)
 Stable. Stopping Triamterene hydrochlorothiazide.

## 2024-02-20 ENCOUNTER — Ambulatory Visit (HOSPITAL_COMMUNITY)

## 2024-02-20 DIAGNOSIS — R7303 Prediabetes: Secondary | ICD-10-CM | POA: Diagnosis not present

## 2024-02-20 DIAGNOSIS — Z130 Encounter for screening for diseases of the blood and blood-forming organs and certain disorders involving the immune mechanism: Secondary | ICD-10-CM | POA: Diagnosis not present

## 2024-02-20 DIAGNOSIS — I10 Essential (primary) hypertension: Secondary | ICD-10-CM | POA: Diagnosis not present

## 2024-02-20 DIAGNOSIS — E785 Hyperlipidemia, unspecified: Secondary | ICD-10-CM | POA: Diagnosis not present

## 2024-02-22 ENCOUNTER — Encounter: Payer: Self-pay | Admitting: Family Medicine

## 2024-02-22 LAB — CMP14+EGFR
ALT: 29 IU/L (ref 0–32)
AST: 27 IU/L (ref 0–40)
Albumin: 4 g/dL (ref 3.9–4.9)
Alkaline Phosphatase: 78 IU/L (ref 44–121)
BUN/Creatinine Ratio: 15 (ref 12–28)
BUN: 13 mg/dL (ref 8–27)
Bilirubin Total: 0.4 mg/dL (ref 0.0–1.2)
CO2: 22 mmol/L (ref 20–29)
Calcium: 9.3 mg/dL (ref 8.7–10.3)
Chloride: 105 mmol/L (ref 96–106)
Creatinine, Ser: 0.87 mg/dL (ref 0.57–1.00)
Globulin, Total: 2.6 g/dL (ref 1.5–4.5)
Glucose: 76 mg/dL (ref 70–99)
Potassium: 4.2 mmol/L (ref 3.5–5.2)
Sodium: 141 mmol/L (ref 134–144)
Total Protein: 6.6 g/dL (ref 6.0–8.5)
eGFR: 73 mL/min/{1.73_m2} (ref >59)

## 2024-02-22 LAB — MICROALBUMIN / CREATININE URINE RATIO
Creatinine, Urine: 40 mg/dL
Microalb/Creat Ratio: 14 mg/g{creat} (ref 0–29)
Microalbumin, Urine: 5.7 ug/mL

## 2024-02-22 LAB — LIPID PANEL
Chol/HDL Ratio: 2.2 ratio (ref 0.0–4.4)
Cholesterol, Total: 204 mg/dL — ABNORMAL HIGH (ref 100–199)
HDL: 92 mg/dL (ref >39)
LDL Chol Calc (NIH): 101 mg/dL — ABNORMAL HIGH (ref 0–99)
Triglycerides: 58 mg/dL (ref 0–149)
VLDL Cholesterol Cal: 11 mg/dL (ref 5–40)

## 2024-02-22 LAB — CBC
Hematocrit: 36.6 % (ref 34.0–46.6)
Hemoglobin: 12 g/dL (ref 11.1–15.9)
MCH: 27.3 pg (ref 26.6–33.0)
MCHC: 32.8 g/dL (ref 31.5–35.7)
MCV: 83 fL (ref 79–97)
Platelets: 220 10*3/uL (ref 150–450)
RBC: 4.39 x10E6/uL (ref 3.77–5.28)
RDW: 13.7 % (ref 11.7–15.4)
WBC: 5.8 10*3/uL (ref 3.4–10.8)

## 2024-02-22 LAB — HEMOGLOBIN A1C
Est. average glucose Bld gHb Est-mCnc: 126 mg/dL
Hgb A1c MFr Bld: 6 % — ABNORMAL HIGH (ref 4.8–5.6)

## 2024-02-23 ENCOUNTER — Telehealth: Payer: Self-pay | Admitting: *Deleted

## 2024-02-23 NOTE — Telephone Encounter (Signed)
 Left a message for pt to return call

## 2024-02-23 NOTE — Progress Notes (Signed)
Left message for patient to return the call for additional details and recommendations.   

## 2024-02-23 NOTE — Telephone Encounter (Signed)
 Everlene Other G, DO     Lets try a smaller dose if she is okay with that.

## 2024-02-23 NOTE — Telephone Encounter (Signed)
 Copied from CRM [id number]. Topic: Clinical - Medical Advice >> Feb 23, 2024  9:29 AM Fonda Kinder J wrote: Reason for CRM: Pt states it was recommended by DO Cook at her last visit to stop taking Triam-actz 37.5 25mg  (Water pills)because she was having cramps in her left leg. She states stopped taking them for 3 days and both of her legs started to swell so she started taking them again 3 days ago and the swelling has since gone down. She wanted to let her pcp know and see what he advises

## 2024-02-29 ENCOUNTER — Ambulatory Visit (HOSPITAL_COMMUNITY)

## 2024-03-06 ENCOUNTER — Other Ambulatory Visit: Payer: Self-pay | Admitting: Family Medicine

## 2024-03-06 DIAGNOSIS — I10 Essential (primary) hypertension: Secondary | ICD-10-CM

## 2024-03-14 ENCOUNTER — Ambulatory Visit (HOSPITAL_COMMUNITY)
Admission: RE | Admit: 2024-03-14 | Discharge: 2024-03-14 | Disposition: A | Discharge status: Home / Self Care | Source: Ambulatory Visit | Attending: Family Medicine | Admitting: Family Medicine

## 2024-03-14 ENCOUNTER — Encounter (HOSPITAL_COMMUNITY): Payer: Self-pay

## 2024-03-14 DIAGNOSIS — Z1231 Encounter for screening mammogram for malignant neoplasm of breast: Secondary | ICD-10-CM | POA: Diagnosis not present

## 2024-04-18 ENCOUNTER — Other Ambulatory Visit: Payer: Self-pay

## 2024-04-18 ENCOUNTER — Other Ambulatory Visit: Payer: Self-pay | Admitting: Family Medicine

## 2024-04-18 DIAGNOSIS — I10 Essential (primary) hypertension: Secondary | ICD-10-CM

## 2024-04-18 MED ORDER — AMLODIPINE BESYLATE 5 MG PO TABS: 5.0000 mg | ORAL_TABLET | Freq: Every day | ORAL | 3 refills | Status: DC

## 2024-04-26 DIAGNOSIS — H52223 Regular astigmatism, bilateral: Secondary | ICD-10-CM | POA: Diagnosis not present

## 2024-05-08 ENCOUNTER — Ambulatory Visit: Payer: Medicare Other | Admitting: Family Medicine

## 2024-05-08 ENCOUNTER — Encounter: Payer: Self-pay | Admitting: Family Medicine

## 2024-05-08 VITALS — BP 134/84 | HR 68 | Temp 97.6°F | Wt 160.0 lb

## 2024-05-08 DIAGNOSIS — E785 Hyperlipidemia, unspecified: Secondary | ICD-10-CM | POA: Diagnosis not present

## 2024-05-08 DIAGNOSIS — I10 Essential (primary) hypertension: Secondary | ICD-10-CM | POA: Diagnosis not present

## 2024-05-08 MED ORDER — LISINOPRIL 5 MG PO TABS: 5.0000 mg | ORAL_TABLET | Freq: Every day | ORAL | 0 refills | Status: DC

## 2024-05-08 NOTE — Progress Notes (Signed)
 Subjective:  Patient ID: Erin Foster, female    DOB: 18-Oct-1956  Age: 68 y.o. MRN: [id number]  CC:   Chief Complaint  Patient presents with   Follow-up    Patient in room #1 and alone. Patient state she here for medications management.    HPI:  68 year old female presents for follow up.   Patient states that overall she is doing well.  Hypertension stable on amlodipine  and lisinopril .  Patient reports that she is still bothered intermittently by cramps in her lower extremities.  Has had negative workup.  Has had recent labs.  Lipids not at goal.  However, she has been reluctant to change to high intensity statin therapy.  Patient Active Problem List   Diagnosis Date Noted   Chronic constipation 08/19/2023   Leg cramps 04/26/2023   Goiter 02/16/2023   Preventative health care 01/10/2023   Essential hypertension, benign 07/24/2013   Hyperlipidemia LDL goal <70 07/24/2013   History of CVA (cerebrovascular accident) 07/24/2013    Social Hx   Social History   Socioeconomic History   Marital status: Married    Spouse name: Not on file   Number of children: Not on file   Years of education: Not on file   Highest education level: Not on file  Occupational History   Not on file  Tobacco Use   Smoking status: Never   Smokeless tobacco: Never  Vaping Use   Vaping status: Never Used  Substance and Sexual Activity   Alcohol use: No    Alcohol/week: 0.0 standard drinks of alcohol   Drug use: No   Sexual activity: Yes    Partners: Male    Birth control/protection: None, Post-menopausal  Other Topics Concern   Not on file  Social History Narrative   Not on file   Social Drivers of Health   Financial Resource Strain: Not on file  Food Insecurity: Not on file  Transportation Needs: Not on file  Physical Activity: Not on file  Stress: Not on file  Social Connections: Not on file    Review of Systems Per HPI  Objective:  BP 134/84   Pulse 68   Temp 97.6  F (36.4 C)   Wt 160 lb (72.6 kg)   SpO2 100%   BMI 31.25 kg/m      05/08/2024   11:03 AM 05/08/2024   11:02 AM 02/16/2024   10:21 AM  BP/Weight  Systolic BP 134 142 110  Diastolic BP 84 72 68  Wt. (Lbs)  160 162  BMI  31.25 kg/m2 31.64 kg/m2    Physical Exam Vitals and nursing note reviewed.  Constitutional:      General: She is not in acute distress.    Appearance: Normal appearance.  HENT:     Head: Normocephalic and atraumatic.  Eyes:     General:        Right eye: No discharge.        Left eye: No discharge.     Conjunctiva/sclera: Conjunctivae normal.  Cardiovascular:     Rate and Rhythm: Normal rate and regular rhythm.  Pulmonary:     Effort: Pulmonary effort is normal.     Breath sounds: Normal breath sounds. No wheezing, rhonchi or rales.  Neurological:     Mental Status: She is alert.  Psychiatric:        Mood and Affect: Mood normal.        Behavior: Behavior normal.     Lab Results  Component  Value Date   WBC 5.8 02/20/2024   HGB 12.0 02/20/2024   HCT 36.6 02/20/2024   PLT 220 02/20/2024   GLUCOSE 76 02/20/2024   CHOL 204 (H) 02/20/2024   TRIG 58 02/20/2024   HDL 92 02/20/2024   LDLCALC 101 (H) 02/20/2024   ALT 29 02/20/2024   AST 27 02/20/2024   NA 141 02/20/2024   K 4.2 02/20/2024   CL 105 02/20/2024   CREATININE 0.87 02/20/2024   BUN 13 02/20/2024   CO2 22 02/20/2024   TSH 1.510 02/16/2023   HGBA1C 6.0 (H) 02/20/2024     Assessment & Plan:  Essential hypertension, benign Assessment & Plan: Stable.  Continue current medications.  Lisinopril  refilled.  Orders: -     Lisinopril ; Take 1 tablet (5 mg total) by mouth daily.  Dispense: 90 tablet; Refill: 0  Hyperlipidemia LDL goal <70 Assessment & Plan: Continue statin therapy.  Will continue to monitor closely.  Will continue to push for high intensity statin therapy.     Follow-up: 6 Months  Olean Sangster Debrah Fan DO [facility]

## 2024-05-08 NOTE — Assessment & Plan Note (Signed)
 Stable.  Continue current medications.  Lisinopril  refilled.

## 2024-05-08 NOTE — Patient Instructions (Signed)
 Continue your medications.    Follow up in 6 months

## 2024-05-08 NOTE — Assessment & Plan Note (Signed)
 Continue statin therapy.  Will continue to monitor closely.  Will continue to push for high intensity statin therapy.

## 2024-05-14 ENCOUNTER — Other Ambulatory Visit: Payer: Self-pay | Admitting: Family Medicine

## 2024-05-14 DIAGNOSIS — I10 Essential (primary) hypertension: Secondary | ICD-10-CM

## 2024-05-15 DIAGNOSIS — H43822 Vitreomacular adhesion, left eye: Secondary | ICD-10-CM | POA: Diagnosis not present

## 2024-05-15 DIAGNOSIS — H3509 Other intraretinal microvascular abnormalities: Secondary | ICD-10-CM | POA: Diagnosis not present

## 2024-05-15 DIAGNOSIS — H35033 Hypertensive retinopathy, bilateral: Secondary | ICD-10-CM | POA: Diagnosis not present

## 2024-05-15 DIAGNOSIS — H43811 Vitreous degeneration, right eye: Secondary | ICD-10-CM | POA: Diagnosis not present

## 2024-05-16 ENCOUNTER — Other Ambulatory Visit: Payer: Self-pay | Admitting: Family Medicine

## 2024-05-16 DIAGNOSIS — E785 Hyperlipidemia, unspecified: Secondary | ICD-10-CM

## 2024-06-12 ENCOUNTER — Other Ambulatory Visit: Payer: Self-pay | Admitting: Family Medicine

## 2024-06-12 DIAGNOSIS — I10 Essential (primary) hypertension: Secondary | ICD-10-CM

## 2024-06-12 NOTE — Telephone Encounter (Unsigned)
 Copied from CRM [id number]. Topic: Clinical - Medication Refill >> Jun 12, 2024 10:51 AM Precious C wrote: Medication: triamterene -hydrochlorothiazide (DYAZIDE) 37.5-25 MG capsule  Has the patient contacted their pharmacy? Yes  (Agent: If yes, when and what did the pharmacy advise?)Today and pharmacy advised pt to call RX refill in  This is the patient's preferred pharmacy:  [facility] [address] [place] - [address] [place] [address] [address] [place] [address] Phone: [phone] Fax: [phone]   Is this the correct pharmacy for this prescription? Yes If no, delete pharmacy and type the correct one.   Has the prescription been filled recently? No  Is the patient out of the medication? No  Has the patient been seen for an appointment in the last year OR does the patient have an upcoming appointment? Yes  Can we respond through MyChart? No  Agent: Please be advised that Rx refills may take up to 3 business days. We ask that you follow-up with your pharmacy.

## 2024-06-26 ENCOUNTER — Telehealth: Payer: Self-pay | Admitting: Family Medicine

## 2024-06-26 NOTE — Telephone Encounter (Signed)
 Copied from CRM #[id number]. Topic: Clinical - Lab/Test Results >> Jun 26, 2024 10:35 AM Tiffini S wrote: Reason for CRM: Patient called asking for information about the last flu shot she had received - advised 08/19/2023 with the next  INFLUENZA VACCINE due on 06/29/2024. Patient states that she had a flu shot this year and would like to talk with nurse. Please call the patient back at [phone].

## 2024-06-26 NOTE — Telephone Encounter (Signed)
 Spoke with patient regarding when she would need flu shot. Informed patient we do not get Flu vaccines in until late September and will inform her when she is due and when we have them in stock

## 2024-07-15 ENCOUNTER — Other Ambulatory Visit: Payer: Self-pay | Admitting: Family Medicine

## 2024-07-15 DIAGNOSIS — I10 Essential (primary) hypertension: Secondary | ICD-10-CM

## 2024-07-16 ENCOUNTER — Other Ambulatory Visit: Payer: Self-pay

## 2024-07-16 DIAGNOSIS — I10 Essential (primary) hypertension: Secondary | ICD-10-CM

## 2024-07-16 MED ORDER — TRIAMTERENE-HCTZ 37.5-25 MG PO CAPS: 1.0000 | ORAL_CAPSULE | Freq: Every day | ORAL | 6 refills | Status: DC

## 2024-08-07 DIAGNOSIS — H43811 Vitreous degeneration, right eye: Secondary | ICD-10-CM | POA: Diagnosis not present

## 2024-08-07 DIAGNOSIS — H35033 Hypertensive retinopathy, bilateral: Secondary | ICD-10-CM | POA: Diagnosis not present

## 2024-08-07 DIAGNOSIS — H43822 Vitreomacular adhesion, left eye: Secondary | ICD-10-CM | POA: Diagnosis not present

## 2024-08-07 DIAGNOSIS — H3509 Other intraretinal microvascular abnormalities: Secondary | ICD-10-CM | POA: Diagnosis not present

## 2024-08-20 ENCOUNTER — Ambulatory Visit: Admitting: Family Medicine

## 2024-08-22 ENCOUNTER — Other Ambulatory Visit: Payer: Self-pay | Admitting: Family Medicine

## 2024-08-22 DIAGNOSIS — E785 Hyperlipidemia, unspecified: Secondary | ICD-10-CM

## 2024-08-29 ENCOUNTER — Ambulatory Visit: Payer: Self-pay

## 2024-08-29 DIAGNOSIS — Z23 Encounter for immunization: Secondary | ICD-10-CM | POA: Diagnosis not present

## 2024-09-21 ENCOUNTER — Other Ambulatory Visit: Payer: Self-pay | Admitting: Family Medicine

## 2024-09-21 ENCOUNTER — Telehealth: Payer: Self-pay | Admitting: Family Medicine

## 2024-09-21 DIAGNOSIS — I10 Essential (primary) hypertension: Secondary | ICD-10-CM

## 2024-09-21 NOTE — Telephone Encounter (Unsigned)
 Copied from CRM #[id number]. Topic: Clinical - Medication Refill >> Sep 21, 2024  9:58 AM Delon DASEN wrote: Medication: lisinopril  (ZESTRIL ) 5 MG tablet  Has the patient contacted their pharmacy? No (Agent: If no, request that the patient contact the pharmacy for the refill. If patient does not wish to contact the pharmacy document the reason why and proceed with request.) (Agent: If yes, when and what did the pharmacy advise?)  This is the patient's preferred pharmacy:  [facility] [address] [place] - [address] [place] [address] [place] [address] Phone: [phone] Fax: [phone]   Is this the correct pharmacy for this prescription? Yes If no, delete pharmacy and type the correct one.   Has the prescription been filled recently? Yes  Is the patient out of the medication? Yes  Has the patient been seen for an appointment in the last year OR does the patient have an upcoming appointment? Yes  Can we respond through MyChart? Yes  Agent: Please be advised that Rx refills may take up to 3 business days. We ask that you follow-up with your pharmacy.

## 2024-11-06 DIAGNOSIS — H43811 Vitreous degeneration, right eye: Secondary | ICD-10-CM | POA: Diagnosis not present

## 2024-11-06 DIAGNOSIS — H43822 Vitreomacular adhesion, left eye: Secondary | ICD-10-CM | POA: Diagnosis not present

## 2024-11-06 DIAGNOSIS — H3509 Other intraretinal microvascular abnormalities: Secondary | ICD-10-CM | POA: Diagnosis not present

## 2024-11-06 DIAGNOSIS — H35033 Hypertensive retinopathy, bilateral: Secondary | ICD-10-CM | POA: Diagnosis not present

## 2024-11-07 ENCOUNTER — Encounter: Payer: Self-pay | Admitting: Family Medicine

## 2024-11-07 ENCOUNTER — Ambulatory Visit: Admitting: Family Medicine

## 2024-11-07 VITALS — BP 97/55 | HR 60 | Temp 97.2°F | Ht 60.0 in | Wt 166.0 lb

## 2024-11-07 DIAGNOSIS — I10 Essential (primary) hypertension: Secondary | ICD-10-CM | POA: Diagnosis not present

## 2024-11-07 DIAGNOSIS — R7303 Prediabetes: Secondary | ICD-10-CM | POA: Diagnosis not present

## 2024-11-07 DIAGNOSIS — R42 Dizziness and giddiness: Secondary | ICD-10-CM | POA: Diagnosis not present

## 2024-11-07 DIAGNOSIS — E785 Hyperlipidemia, unspecified: Secondary | ICD-10-CM | POA: Diagnosis not present

## 2024-11-07 DIAGNOSIS — I959 Hypotension, unspecified: Secondary | ICD-10-CM | POA: Insufficient documentation

## 2024-11-07 NOTE — Progress Notes (Signed)
 Subjective:  Patient ID: Erin Foster, female    DOB: 04-18-1956  Age: 68 y.o. MRN: [id number]  CC:   Chief Complaint  Patient presents with   Hypertension    Dizziness this morning    Hyperlipidemia    HPI:  68 year old female presents for follow-up.  Patient reports that she does not feel well today.  Feels dizzy.  She has not taken her blood pressure medication and her blood pressure is 97/55.  She states that she has not been sick.  She is typically on a lot of pain, lisinopril , and triamterene /HCTZ.    Denies chest pain or shortness of breath.  She has no other symptoms at this time.  Patient Active Problem List   Diagnosis Date Noted   Hypotension 11/07/2024   Chronic constipation 08/19/2023   Leg cramps 04/26/2023   Goiter 02/16/2023   Preventative health care 01/10/2023   Essential hypertension, benign 07/24/2013   Hyperlipidemia LDL goal <70 07/24/2013   History of CVA (cerebrovascular accident) 07/24/2013    Social Hx   Social History   Socioeconomic History   Marital status: Married    Spouse name: Not on file   Number of children: Not on file   Years of education: Not on file   Highest education level: Not on file  Occupational History   Not on file  Tobacco Use   Smoking status: Never   Smokeless tobacco: Never  Vaping Use   Vaping status: Never Used  Substance and Sexual Activity   Alcohol use: No    Alcohol/week: 0.0 standard drinks of alcohol   Drug use: No   Sexual activity: Yes    Partners: Male    Birth control/protection: None, Post-menopausal  Other Topics Concern   Not on file  Social History Narrative   Not on file   Social Drivers of Health   Financial Resource Strain: Not on file  Food Insecurity: Not on file  Transportation Needs: Not on file  Physical Activity: Not on file  Stress: Not on file  Social Connections: Not on file    Review of Systems Per HPI  Objective:  BP (!) 97/55   Pulse 60   Temp (!) 97.2  F (36.2 C)   Ht 5' (1.524 m)   Wt 166 lb (75.3 kg)   SpO2 99%   BMI 32.42 kg/m      11/07/2024   10:36 AM 05/08/2024   11:03 AM 05/08/2024   11:02 AM  BP/Weight  Systolic BP 97 134 142  Diastolic BP 55 84 72  Wt. (Lbs) 166  160  BMI 32.42 kg/m2  31.25 kg/m2    Physical Exam Vitals and nursing note reviewed.  Constitutional:      General: She is not in acute distress.    Appearance: Normal appearance.  HENT:     Head: Normocephalic and atraumatic.  Eyes:     General:        Right eye: No discharge.        Left eye: No discharge.     Conjunctiva/sclera: Conjunctivae normal.  Cardiovascular:     Rate and Rhythm: Normal rate and regular rhythm.  Pulmonary:     Effort: Pulmonary effort is normal.     Breath sounds: Normal breath sounds. No wheezing, rhonchi or rales.  Neurological:     Mental Status: She is alert.  Psychiatric:        Mood and Affect: Mood normal.  Behavior: Behavior normal.     Lab Results  Component Value Date   WBC 5.8 02/20/2024   HGB 12.0 02/20/2024   HCT 36.6 02/20/2024   PLT 220 02/20/2024   GLUCOSE 76 02/20/2024   CHOL 204 (H) 02/20/2024   TRIG 58 02/20/2024   HDL 92 02/20/2024   LDLCALC 101 (H) 02/20/2024   ALT 29 02/20/2024   AST 27 02/20/2024   NA 141 02/20/2024   K 4.2 02/20/2024   CL 105 02/20/2024   CREATININE 0.87 02/20/2024   BUN 13 02/20/2024   CO2 22 02/20/2024   TSH 1.510 02/16/2023   HGBA1C 6.0 (H) 02/20/2024     Assessment & Plan:  Hypotension, unspecified hypotension type Assessment & Plan: He is unclear why she is hypotensive here today.  Discontinuing triamterene /HCTZ.  Holding amlodipine  and lisinopril .  Labs today.  Orders: -     CMP14+EGFR  Hyperlipidemia LDL goal <70 Assessment & Plan: Lipid panel today to assess.  LDL not at goal on simvastatin .  I have previously recommended to change to high intensity statin.  Orders: -     Lipid panel  Dizziness -     CBC  Prediabetes -      Hemoglobin A1c    Follow-up: Next week for reevaluation of blood pressure.  Jacqulyn Ahle DO [facility]

## 2024-11-07 NOTE — Assessment & Plan Note (Addendum)
 Lipid panel today to assess.  LDL not at goal on simvastatin .  I have previously recommended to change to high intensity statin.

## 2024-11-07 NOTE — Patient Instructions (Signed)
 Hold your blood pressure medication until you hear from me.  Labs today.  Stay hydrated.  Follow up next week.

## 2024-11-07 NOTE — Assessment & Plan Note (Signed)
 He is unclear why she is hypotensive here today.  Discontinuing triamterene /HCTZ.  Holding amlodipine  and lisinopril .  Labs today.

## 2024-11-08 ENCOUNTER — Ambulatory Visit: Payer: Self-pay | Admitting: Family Medicine

## 2024-11-08 LAB — CMP14+EGFR
ALT: 22 IU/L (ref 0–32)
AST: 27 IU/L (ref 0–40)
Albumin: 4.1 g/dL (ref 3.9–4.9)
Alkaline Phosphatase: 72 IU/L (ref 49–135)
BUN/Creatinine Ratio: 22 (ref 12–28)
BUN: 24 mg/dL (ref 8–27)
Bilirubin Total: 0.4 mg/dL (ref 0.0–1.2)
CO2: 24 mmol/L (ref 20–29)
Calcium: 9.4 mg/dL (ref 8.7–10.3)
Chloride: 103 mmol/L (ref 96–106)
Creatinine, Ser: 1.07 mg/dL — ABNORMAL HIGH (ref 0.57–1.00)
Globulin, Total: 2.7 g/dL (ref 1.5–4.5)
Glucose: 88 mg/dL (ref 70–99)
Potassium: 4.1 mmol/L (ref 3.5–5.2)
Sodium: 139 mmol/L (ref 134–144)
Total Protein: 6.8 g/dL (ref 6.0–8.5)
eGFR: 57 mL/min/1.73 — ABNORMAL LOW (ref >59)

## 2024-11-08 LAB — CBC
Hematocrit: 39.3 % (ref 34.0–46.6)
Hemoglobin: 12.7 g/dL (ref 11.1–15.9)
MCH: 26.6 pg (ref 26.6–33.0)
MCHC: 32.3 g/dL (ref 31.5–35.7)
MCV: 82 fL (ref 79–97)
Platelets: 223 x10E3/uL (ref 150–450)
RBC: 4.77 x10E6/uL (ref 3.77–5.28)
RDW: 13.4 % (ref 11.7–15.4)
WBC: 5.1 x10E3/uL (ref 3.4–10.8)

## 2024-11-08 LAB — LIPID PANEL
Chol/HDL Ratio: 2.3 ratio (ref 0.0–4.4)
Cholesterol, Total: 217 mg/dL — ABNORMAL HIGH (ref 100–199)
HDL: 96 mg/dL (ref >39)
LDL Chol Calc (NIH): 111 mg/dL — ABNORMAL HIGH (ref 0–99)
Triglycerides: 55 mg/dL (ref 0–149)
VLDL Cholesterol Cal: 10 mg/dL (ref 5–40)

## 2024-11-08 LAB — HEMOGLOBIN A1C
Est. average glucose Bld gHb Est-mCnc: 120 mg/dL
Hgb A1c MFr Bld: 5.8 % — ABNORMAL HIGH (ref 4.8–5.6)

## 2024-11-09 ENCOUNTER — Ambulatory Visit: Payer: Medicare Other

## 2024-11-12 ENCOUNTER — Other Ambulatory Visit: Payer: Self-pay

## 2024-11-14 ENCOUNTER — Ambulatory Visit: Admitting: Family Medicine

## 2024-11-14 ENCOUNTER — Encounter: Payer: Self-pay | Admitting: Family Medicine

## 2024-11-14 DIAGNOSIS — I10 Essential (primary) hypertension: Secondary | ICD-10-CM

## 2024-11-14 MED ORDER — AMLODIPINE BESYLATE 2.5 MG PO TABS: 2.5000 mg | ORAL_TABLET | Freq: Every day | ORAL | 1 refills | Status: DC

## 2024-11-14 NOTE — Progress Notes (Signed)
 Subjective:  Patient ID: Icess Bertoni, female    DOB: 1955-12-20  Age: 68 y.o. MRN: [id number]  CC:   Chief Complaint  Patient presents with   Hypertension    Follow up - pt brought home readings     HPI:  68 year old female presents for follow up.  Patient was hypotensive at her last visit and triamterene /HCTZ was discontinued and the remainder of her blood pressure medications were held.  Labs notable for creatinine of 1.07. Patient states that she is feeling well today.  No dizziness.  BP 153/66.  Patient Active Problem List   Diagnosis Date Noted   Chronic constipation 08/19/2023   Leg cramps 04/26/2023   Goiter 02/16/2023   Preventative health care 01/10/2023   Essential hypertension, benign 07/24/2013   Hyperlipidemia LDL goal <70 07/24/2013   History of CVA (cerebrovascular accident) 07/24/2013    Social Hx   Social History   Socioeconomic History   Marital status: Married    Spouse name: Not on file   Number of children: Not on file   Years of education: Not on file   Highest education level: Not on file  Occupational History   Not on file  Tobacco Use   Smoking status: Never   Smokeless tobacco: Never  Vaping Use   Vaping status: Never Used  Substance and Sexual Activity   Alcohol use: No    Alcohol/week: 0.0 standard drinks of alcohol   Drug use: No   Sexual activity: Yes    Partners: Male    Birth control/protection: None, Post-menopausal  Other Topics Concern   Not on file  Social History Narrative   Not on file   Social Drivers of Health   Tobacco Use: Low Risk (11/14/2024)   Patient History    Smoking Tobacco Use: Never    Smokeless Tobacco Use: Never    Passive Exposure: Not on file  Financial Resource Strain: Not on file  Food Insecurity: Not on file  Transportation Needs: Not on file  Physical Activity: Not on file  Stress: Not on file  Social Connections: Not on file  Depression (PHQ2-9): Low Risk (11/14/2024)    Depression (PHQ2-9)    PHQ-2 Score: 0  Alcohol Screen: Not on file  Housing: Not on file  Utilities: Not on file  Health Literacy: Not on file    Review of Systems Per HPI  Objective:  BP (!) 153/66   Pulse 60   Temp (!) 97 F (36.1 C)   Ht 5' (1.524 m)   Wt 169 lb (76.7 kg)   SpO2 100%   BMI 33.01 kg/m      11/14/2024   10:09 AM 11/07/2024   10:36 AM 05/08/2024   11:03 AM  BP/Weight  Systolic BP 153 97 134  Diastolic BP 66 55 84  Wt. (Lbs) 169 166   BMI 33.01 kg/m2 32.42 kg/m2     Physical Exam Vitals and nursing note reviewed.  Constitutional:      General: She is not in acute distress.    Appearance: Normal appearance.  HENT:     Head: Normocephalic and atraumatic.  Cardiovascular:     Rate and Rhythm: Normal rate and regular rhythm.  Pulmonary:     Effort: Pulmonary effort is normal.     Breath sounds: Normal breath sounds.  Neurological:     Mental Status: She is alert.  Psychiatric:        Mood and Affect: Mood normal.  Behavior: Behavior normal.     Lab Results  Component Value Date   WBC 5.1 11/07/2024   HGB 12.7 11/07/2024   HCT 39.3 11/07/2024   PLT 223 11/07/2024   GLUCOSE 88 11/07/2024   CHOL 217 (H) 11/07/2024   TRIG 55 11/07/2024   HDL 96 11/07/2024   LDLCALC 111 (H) 11/07/2024   ALT 22 11/07/2024   AST 27 11/07/2024   NA 139 11/07/2024   K 4.1 11/07/2024   CL 103 11/07/2024   CREATININE 1.07 (H) 11/07/2024   BUN 24 11/07/2024   CO2 24 11/07/2024   TSH 1.510 02/16/2023   HGBA1C 5.8 (H) 11/07/2024     Assessment & Plan:  Essential hypertension, benign Assessment & Plan: Hypotension resolved.  BP mildly elevated here today at 153/66.  Starting low-dose amlodipine .  Follow-up in 2 weeks.  Orders: -     amLODIPine  Besylate; Take 1 tablet (2.5 mg total) by mouth daily.  Dispense: 90 tablet; Refill: 1    Follow-up:  2 weeks  Suzzette Gasparro Bluford DO [facility]

## 2024-11-14 NOTE — Assessment & Plan Note (Signed)
 Hypotension resolved.  BP mildly elevated here today at 153/66.  Starting low-dose amlodipine .  Follow-up in 2 weeks.

## 2024-11-14 NOTE — Patient Instructions (Signed)
 Amlodipine  2.5 mg daily.  Follow up in 2 weeks.

## 2024-11-28 ENCOUNTER — Other Ambulatory Visit: Payer: Self-pay | Admitting: Family Medicine

## 2024-11-28 ENCOUNTER — Ambulatory Visit: Admitting: Family Medicine

## 2024-11-28 DIAGNOSIS — E785 Hyperlipidemia, unspecified: Secondary | ICD-10-CM

## 2024-11-30 ENCOUNTER — Ambulatory Visit (INDEPENDENT_AMBULATORY_CARE_PROVIDER_SITE_OTHER): Admitting: Physician Assistant

## 2024-11-30 ENCOUNTER — Encounter: Payer: Self-pay | Admitting: Physician Assistant

## 2024-11-30 VITALS — BP 162/82 | HR 64 | Temp 97.3°F | Ht 60.0 in | Wt 164.8 lb

## 2024-11-30 DIAGNOSIS — I10 Essential (primary) hypertension: Secondary | ICD-10-CM | POA: Diagnosis not present

## 2024-11-30 MED ORDER — HYDROCHLOROTHIAZIDE 12.5 MG PO TABS: 12.5000 mg | ORAL_TABLET | Freq: Every day | ORAL | 1 refills | Status: DC

## 2024-11-30 NOTE — Assessment & Plan Note (Signed)
 165/86, 162/82 Uncontrolled. Stop amlodipine . Start hydrochlorothiazide.  Bmp in 7-10 days.  Discussed DASH diet and dietary sodium restrictions.  Increase dietary efforts and physical activity. Follow up in 2 weeks for re-evaluation.

## 2024-11-30 NOTE — Progress Notes (Signed)
 "  Established Patient Office Visit  Subjective   Patient ID: Erin Foster, female    DOB: [birth year]  Age: 69 y.o. MRN: [id number]  Chief Complaint  Patient presents with   Hypertension    PT was put on simvastatin  but doesn't think its working PT reports BP this morning was 165/92   Foot Swelling    PT is experiencing swelling in feet    Hypertension, follow-up  BP Readings from Last 3 Encounters:  11/30/24 (!) 162/82  11/14/24 (!) 153/66  11/07/24 (!) 97/55   Wt Readings from Last 3 Encounters:  11/30/24 164 lb 12.8 oz (74.8 kg)  11/14/24 169 lb (76.7 kg)  11/07/24 166 lb (75.3 kg)     She was last seen for hypertension 2 weeks ago.  BP at that visit was 153/66. Management since that visit includes amlodipine  2.5 mg.  She reports good compliance with treatment. She is having side effects. Reports bilateral ankle swelling since discontinuing hydrochlorothiazide and starting amlodipine .  She is following a Regular diet. She is not exercising. She does not smoke.  Use of agents associated with hypertension: none.   Symptoms: No chest pain No chest pressure  No palpitations No syncope  No dyspnea No orthopnea  No paroxysmal nocturnal dyspnea Yes lower extremity edema   Pertinent labs Lab Results  Component Value Date   CHOL 217 (H) 11/07/2024   HDL 96 11/07/2024   LDLCALC 111 (H) 11/07/2024   TRIG 55 11/07/2024   CHOLHDL 2.3 11/07/2024   Lab Results  Component Value Date   NA 139 11/07/2024   K 4.1 11/07/2024   CREATININE 1.07 (H) 11/07/2024   EGFR 57 (L) 11/07/2024   GLUCOSE 88 11/07/2024   TSH 1.510 02/16/2023     The ASCVD Risk score (Arnett DK, et al., 2019) failed to calculate for the following reasons:   Risk score cannot be calculated because patient has a medical history suggesting prior/existing ASCVD   * - Cholesterol units were assumed   Review of Systems  Constitutional:  Negative for appetite change, fatigue and fever.  Eyes:   Negative for visual disturbance.  Respiratory:  Negative for cough and shortness of breath.   Cardiovascular:  Positive for leg swelling. Negative for chest pain.  Neurological:  Negative for light-headedness and headaches.       Objective:     BP (!) 162/82   Pulse 64   Temp (!) 97.3 F (36.3 C)   Ht 5' (1.524 m)   Wt 164 lb 12.8 oz (74.8 kg)   SpO2 100%   BMI 32.19 kg/m    Physical Exam Constitutional:      General: She is not in acute distress.    Appearance: Normal appearance. She is obese. She is not ill-appearing.  HENT:     Head: Normocephalic and atraumatic.     Mouth/Throat:     Mouth: Mucous membranes are moist.     Pharynx: Oropharynx is clear.  Eyes:     Extraocular Movements: Extraocular movements intact.     Conjunctiva/sclera: Conjunctivae normal.  Cardiovascular:     Rate and Rhythm: Normal rate and regular rhythm.     Heart sounds: Normal heart sounds. No murmur heard. Pulmonary:     Effort: Pulmonary effort is normal.     Breath sounds: Normal breath sounds. No wheezing, rhonchi or rales.  Musculoskeletal:     Right lower leg: Edema present.     Left lower leg: Edema present.  Skin:  General: Skin is warm and dry.  Neurological:     General: No focal deficit present.     Mental Status: She is alert and oriented to person, place, and time.  Psychiatric:        Mood and Affect: Mood normal.        Behavior: Behavior normal.     No results found for any visits on 11/30/24.  The ASCVD Risk score (Arnett DK, et al., 2019) failed to calculate for the following reasons:   Risk score cannot be calculated because patient has a medical history suggesting prior/existing ASCVD   * - Cholesterol units were assumed    Assessment & Plan:   Return in about 2 weeks (around 12/14/2024) for BP.   Essential hypertension, benign Assessment & Plan: 165/86, 162/82 Uncontrolled. Stop amlodipine . Start hydrochlorothiazide.  Bmp in 7-10 days.  Discussed  DASH diet and dietary sodium restrictions.  Increase dietary efforts and physical activity. Follow up in 2 weeks for re-evaluation.   Orders: -     hydroCHLOROthiazide; Take 1 tablet (12.5 mg total) by mouth daily.  Dispense: 90 tablet; Refill: 1 -     Basic metabolic panel with GFR    Charmaine Antwan Pandya, PA-C "

## 2024-12-11 ENCOUNTER — Ambulatory Visit: Payer: Self-pay | Admitting: Physician Assistant

## 2024-12-11 LAB — BASIC METABOLIC PANEL WITH GFR
BUN/Creatinine Ratio: 20 (ref 12–28)
BUN: 20 mg/dL (ref 8–27)
CO2: 24 mmol/L (ref 20–29)
Calcium: 9.6 mg/dL (ref 8.7–10.3)
Chloride: 105 mmol/L (ref 96–106)
Creatinine, Ser: 1 mg/dL (ref 0.57–1.00)
Glucose: 84 mg/dL (ref 70–99)
Potassium: 4.1 mmol/L (ref 3.5–5.2)
Sodium: 143 mmol/L (ref 134–144)
eGFR: 62 mL/min/1.73

## 2024-12-13 ENCOUNTER — Encounter: Payer: Self-pay | Admitting: Physician Assistant

## 2024-12-13 ENCOUNTER — Ambulatory Visit: Admitting: Physician Assistant

## 2024-12-13 VITALS — BP 142/90 | HR 57 | Temp 97.7°F | Ht 60.0 in | Wt 166.4 lb

## 2024-12-13 DIAGNOSIS — I10 Essential (primary) hypertension: Secondary | ICD-10-CM

## 2024-12-13 MED ORDER — HYDROCHLOROTHIAZIDE 25 MG PO TABS: 25.0000 mg | ORAL_TABLET | Freq: Every day | ORAL | 3 refills | Status: AC

## 2024-12-13 NOTE — Progress Notes (Signed)
 "  Established Patient Office Visit  Subjective   Patient ID: Erin Foster, female    DOB: 04/10/1956  Age: 69 y.o. MRN: [id number]  Chief Complaint  Patient presents with   Hypertension    Follow up bp  @ home readings very high    Hypertension, follow-up  BP Readings from Last 3 Encounters:  12/13/24 (!) 142/90  11/30/24 (!) 162/82  11/14/24 (!) 153/66   Wt Readings from Last 3 Encounters:  12/13/24 166 lb 6.4 oz (75.5 kg)  11/30/24 164 lb 12.8 oz (74.8 kg)  11/14/24 169 lb (76.7 kg)     She was last seen for hypertension 2 weeks ago.  BP at that visit was 162/82. Management since that visit includes stopping amlodipine  due to side effects, start hydrochlorothiazide  12.5 mg.  She reports excellent compliance with treatment. She is not having side effects.  She is following a Regular diet. She is not exercising. She does not smoke.  Use of agents associated with hypertension: none.   Outside blood pressures are reportedly 145/70s at home. Symptoms: No chest pain No chest pressure  No palpitations No syncope  No dyspnea No orthopnea  No paroxysmal nocturnal dyspnea No lower extremity edema   Pertinent labs Lab Results  Component Value Date   CHOL 217 (H) 11/07/2024   HDL 96 11/07/2024   LDLCALC 111 (H) 11/07/2024   TRIG 55 11/07/2024   CHOLHDL 2.3 11/07/2024   Lab Results  Component Value Date   NA 143 12/10/2024   K 4.1 12/10/2024   CREATININE 1.00 12/10/2024   EGFR 62 12/10/2024   GLUCOSE 84 12/10/2024   TSH 1.510 02/16/2023     The ASCVD Risk score (Arnett DK, et al., 2019) failed to calculate for the following reasons:   Risk score cannot be calculated because patient has a medical history suggesting prior/existing ASCVD   * - Cholesterol units were assumed   ROS- per HPI     Objective:     BP (!) 142/90 (Patient Position: Standing)   Pulse (!) 57   Temp 97.7 F (36.5 C)   Ht 5' (1.524 m)   Wt 166 lb 6.4 oz (75.5 kg)   SpO2 99%    BMI 32.50 kg/m    Physical Exam Constitutional:      General: She is not in acute distress.    Appearance: Normal appearance. She is not ill-appearing.  HENT:     Head: Normocephalic and atraumatic.     Mouth/Throat:     Mouth: Mucous membranes are moist.     Pharynx: Oropharynx is clear.  Eyes:     Extraocular Movements: Extraocular movements intact.     Conjunctiva/sclera: Conjunctivae normal.  Cardiovascular:     Rate and Rhythm: Normal rate and regular rhythm.     Heart sounds: Normal heart sounds. No murmur heard. Pulmonary:     Effort: Pulmonary effort is normal.     Breath sounds: Normal breath sounds. No wheezing, rhonchi or rales.  Musculoskeletal:     Right lower leg: No edema.     Left lower leg: No edema.  Skin:    General: Skin is warm and dry.  Neurological:     General: No focal deficit present.     Mental Status: She is alert and oriented to person, place, and time.  Psychiatric:        Mood and Affect: Mood normal.        Behavior: Behavior normal.     No  results found for any visits on 12/13/24.  The ASCVD Risk score (Arnett DK, et al., 2019) failed to calculate for the following reasons:   Risk score cannot be calculated because patient has a medical history suggesting prior/existing ASCVD   * - Cholesterol units were assumed    Assessment & Plan:   Return in about 4 weeks (around 01/10/2025) for BP.   Essential hypertension, benign Assessment & Plan: 142/90 Above goal. Increase hydrochlorothiazide  to 25 mg daily.  Discussed DASH diet and dietary sodium restrictions.  Increase dietary efforts and physical activity. Follow up in 4 weeks with home blood pressure cuff for re-evaluation, or sooner for new symptoms such as chest pain, shortness of breath, headache, or visual changes.   Orders: -     hydroCHLOROthiazide ; Take 1 tablet (25 mg total) by mouth daily.  Dispense: 90 tablet; Refill: 3    Erin Brunet, PA-C "

## 2024-12-13 NOTE — Assessment & Plan Note (Signed)
 142/90 Above goal. Increase hydrochlorothiazide  to 25 mg daily.  Discussed DASH diet and dietary sodium restrictions.  Increase dietary efforts and physical activity. Follow up in 4 weeks with home blood pressure cuff for re-evaluation, or sooner for new symptoms such as chest pain, shortness of breath, headache, or visual changes.

## 2024-12-14 ENCOUNTER — Ambulatory Visit: Admitting: Physician Assistant

## 2024-12-20 ENCOUNTER — Ambulatory Visit

## 2024-12-20 VITALS — BP 145/74 | Ht 60.0 in | Wt 169.0 lb

## 2024-12-20 DIAGNOSIS — Z0000 Encounter for general adult medical examination without abnormal findings: Secondary | ICD-10-CM | POA: Diagnosis not present

## 2024-12-20 NOTE — Progress Notes (Signed)
 "  Per patient, her blood pressure is continuing to be elevated on home bp monitor since medications were discontinued.  Chief Complaint  Patient presents with   Medicare Wellness     Subjective:   Erin Foster is a 69 y.o. female who presents for a Medicare Annual Wellness Visit.  Visit info / Clinical Intake: Medicare Wellness Visit Type:: Subsequent Annual Wellness Visit Persons participating in visit and providing information:: patient Medicare Wellness Visit Mode:: Video Since this visit was completed virtually, some vitals may be partially provided or unavailable. Missing vitals are due to the limitations of the virtual format.: Documented vitals are patient reported If Telephone or Video please confirm:: I connected with patient using audio/video enable telemedicine. I verified patient identity with two identifiers, discussed telehealth limitations, and patient agreed to proceed. Patient Location:: home Provider Location:: office Interpreter Needed?: No Pre-visit prep was completed: yes AWV questionnaire completed by patient prior to visit?: no Living arrangements:: lives with spouse/significant other Patient's Overall Health Status Rating: (!) fair Typical amount of pain: none Does pain affect daily life?: no Are you currently prescribed opioids?: no  Dietary Habits and Nutritional Risks How many meals a day?: 2 Eats fruit and vegetables daily?: yes Most meals are obtained by: preparing own meals In the last 2 weeks, have you had any of the following?: none Diabetic:: no  Functional Status Activities of Daily Living (to include ambulation/medication): Independent Ambulation: Independent Medication Administration: Independent Home Management (perform basic housework or laundry): Independent Manage your own finances?: yes Primary transportation is: driving Concerns about vision?: no *vision screening is required for WTM* Concerns about hearing?: no  Fall  Screening Falls in the past year?: 0 Number of falls in past year: 0 Was there an injury with Fall?: 0 Fall Risk Category Calculator: 0 Patient Fall Risk Level: Low Fall Risk  Fall Risk Patient at Risk for Falls Due to: No Fall Risks Fall risk Follow up: Falls evaluation completed; Education provided  Home and Transportation Safety: All rugs have non-skid backing?: N/A, no rugs All stairs or steps have railings?: N/A, no stairs Grab bars in the bathtub or shower?: (!) no Have non-skid surface in bathtub or shower?: (!) no Good home lighting?: yes Regular seat belt use?: yes Hospital stays in the last year:: no  Cognitive Assessment Difficulty concentrating, remembering, or making decisions? : no Will 6CIT or Mini Cog be Completed: yes What year is it?: 0 points What month is it?: 0 points Give patient an address phrase to remember (5 components): [address] [place] About what time is it?: 0 points Count backwards from 20 to 1: 0 points Say the months of the year in reverse: 0 points Repeat the address phrase from earlier: 0 points 6 CIT Score: 0 points  Advance Directives (For Healthcare) Does Patient Have a Medical Advance Directive?: No Would patient like information on creating a medical advance directive?: No - Patient declined  Reviewed/Updated  Reviewed/Updated: Reviewed All (Medical, Surgical, Family, Medications, Allergies, Care Teams, Patient Goals)    Allergies (verified) Patient has no known allergies.   Current Medications (verified) Outpatient Encounter Medications as of 12/20/2024  Medication Sig   aspirin 81 MG tablet Take 81 mg by mouth daily.   hydrochlorothiazide  (HYDRODIURIL ) 25 MG tablet Take 1 tablet (25 mg total) by mouth daily.   simvastatin  (ZOCOR ) 20 MG tablet TAKE 1 TABLET BY MOUTH AT BEDTIME   No facility-administered encounter medications on file as of 12/20/2024.    History:  Past Medical History:  Diagnosis Date   Asthma     CVA (cerebral vascular accident) (HCC)    Hyperlipidemia    Hypertension    Past Surgical History:  Procedure Laterality Date   COLONOSCOPY     Family History  Problem Relation Age of Onset   Diabetes Mother    Hypertension Mother    Hyperlipidemia Mother    Hypertension Sister    Hypertension Sister    Breast cancer Maternal Aunt    Cancer Maternal Aunt 43       breast   Social History   Occupational History   Not on file  Tobacco Use   Smoking status: Never   Smokeless tobacco: Never  Vaping Use   Vaping status: Never Used  Substance and Sexual Activity   Alcohol use: No    Alcohol/week: 0.0 standard drinks of alcohol   Drug use: No   Sexual activity: Yes    Partners: Male    Birth control/protection: None, Post-menopausal   Tobacco Counseling Counseling given: Yes  SDOH Screenings   Food Insecurity: No Food Insecurity (12/20/2024)  Housing: Low Risk (12/20/2024)  Transportation Needs: No Transportation Needs (12/20/2024)  Utilities: Not At Risk (12/20/2024)  Depression (PHQ2-9): Low Risk (12/20/2024)  Physical Activity: Sufficiently Active (12/20/2024)  Social Connections: Moderately Integrated (12/20/2024)  Stress: No Stress Concern Present (12/20/2024)  Tobacco Use: Low Risk (12/20/2024)  Health Literacy: Adequate Health Literacy (12/20/2024)   See flowsheets for full screening details  Depression Screen PHQ 2 & 9 Depression Scale- Over the past 2 weeks, how often have you been bothered by any of the following problems? Little interest or pleasure in doing things: 0 Feeling down, depressed, or hopeless (PHQ Adolescent also includes...irritable): 0 PHQ-2 Total Score: 0 Trouble falling or staying asleep, or sleeping too much: 0 Feeling tired or having little energy: 0 Poor appetite or overeating (PHQ Adolescent also includes...weight loss): 0 Feeling bad about yourself - or that you are a failure or have let yourself or your family down: 0 Trouble concentrating  on things, such as reading the newspaper or watching television (PHQ Adolescent also includes...like school work): 0 Moving or speaking so slowly that other people could have noticed. Or the opposite - being so fidgety or restless that you have been moving around a lot more than usual: 0 Thoughts that you would be better off dead, or of hurting yourself in some way: 0 PHQ-9 Total Score: 0 If you checked off any problems, how difficult have these problems made it for you to do your work, take care of things at home, or get along with other people?: Not difficult at all  Depression Treatment Depression Interventions/Treatment : [id number] Score <4 Follow-up Not Indicated     Goals Addressed               This Visit's Progress     Lower my cholesterol and lose weight (pt-stated)               Objective:    Today's Vitals   12/20/24 0946  BP: (!) 145/74  Weight: 169 lb (76.7 kg)  Height: 5' (1.524 m)   Body mass index is 33.01 kg/m.  Hearing/Vision screen Hearing Screening - Comments:: Patient denies any hearing difficulties.   Vision Screening - Comments:: Patient wears reading glasses only. Up to date with yearly exams.  Oneil Kawasaki Immunizations and Health Maintenance Health Maintenance  Topic Date Due   Zoster Vaccines- Shingrix (1 of 2) Never  done   DTaP/Tdap/Td (2 - Td or Tdap) 12/26/2023   Medicare Annual Wellness (AWV)  11/07/2024   Pneumococcal Vaccine: 50+ Years (1 of 1 - PCV) 02/15/2025 (Originally 09/13/2007)   Bone Density Scan  02/13/2025   Mammogram  03/14/2025   Colonoscopy  01/24/2033   Influenza Vaccine  Completed   Meningococcal B Vaccine  Aged Out   COVID-19 Vaccine  Discontinued   Hepatitis C Screening  Discontinued        Assessment/Plan:  This is a routine wellness examination for Erin Foster.  Patient Care Team: Cook, Jayce G, DO as PCP - General [facility]) [facility] (Optometry) Darroll Anes, DO [facility])  I  have personally reviewed and noted the following in the patients chart:   Medical and social history Use of alcohol, tobacco or illicit drugs  Current medications and supplements including opioid prescriptions. Functional ability and status Nutritional status Physical activity Advanced directives List of other physicians Hospitalizations, surgeries, and ER visits in previous 12 months Vitals Screenings to include cognitive, depression, and falls Referrals and appointments  No orders of the defined types were placed in this encounter.  In addition, I have reviewed and discussed with patient certain preventive protocols, quality metrics, and best practice recommendations. A written personalized care plan for preventive services as well as general preventive health recommendations were provided to patient.   Clayton Bosserman, CMA   12/20/2024   Return December 26, 2025 at 9:20 am,, for In office Medicare Well Visit w  Wellness Nurse.  After Visit Summary: (MyChart) Due to this being a telephonic visit, the after visit summary with patients personalized plan was offered to patient via MyChart    "

## 2024-12-20 NOTE — Patient Instructions (Signed)
 Ms. Henken,  Thank you for taking the time for your Medicare Wellness Visit. I appreciate your continued commitment to your health goals. Please review the care plan we discussed, and feel free to reach out if I can assist you further.  Please note that Annual Wellness Visits do not include a physical exam. Some assessments may be limited, especially if the visit was conducted virtually. If needed, we may recommend an in-person follow-up with your provider.  Ongoing Care Seeing your primary care provider every 3 to 6 months helps us  monitor your health and provide consistent, personalized care.   Referrals If a referral was made during today's visit and you haven't received any updates within two weeks, please contact the referred provider directly to check on the status.  Recommended Screenings:  Health Maintenance  Topic Date Due   Zoster (Shingles) Vaccine (1 of 2) Never done   DTaP/Tdap/Td vaccine (2 - Td or Tdap) 12/26/2023   Medicare Annual Wellness Visit  11/07/2024   Pneumococcal Vaccine for age over 8 (1 of 1 - PCV) 02/15/2025*   Osteoporosis screening with Bone Density Scan  02/13/2025   Breast Cancer Screening  03/14/2025   Colon Cancer Screening  01/24/2033   Flu Shot  Completed   Meningitis B Vaccine  Aged Out   COVID-19 Vaccine  Discontinued   Hepatitis C Screening  Discontinued  *Topic was postponed. The date shown is not the original due date.       12/20/2024    9:50 AM  Advanced Directives  Does Patient Have a Medical Advance Directive? No  Would patient like information on creating a medical advance directive? No - Patient declined    Vision: Annual vision screenings are recommended for early detection of glaucoma, cataracts, and diabetic retinopathy. These exams can also reveal signs of chronic conditions such as diabetes and high blood pressure.  Dental: Annual dental screenings help detect early signs of oral cancer, gum disease, and other conditions  linked to overall health, including heart disease and diabetes.  Please see the attached documents for additional preventive care recommendations.

## 2025-01-10 ENCOUNTER — Ambulatory Visit: Admitting: Family Medicine

## 2025-12-26 ENCOUNTER — Ambulatory Visit
